# Patient Record
Sex: Male | Born: 1955 | Race: White | Hispanic: No | Marital: Married | Smoking: Former smoker
Health system: Southern US, Community
[De-identification: ages and names within clinical notes are randomized; demographics above are authoritative.]

## PROBLEM LIST (undated history)

## (undated) DIAGNOSIS — M549 Dorsalgia, unspecified: Secondary | ICD-10-CM

## (undated) DIAGNOSIS — I10 Essential (primary) hypertension: Secondary | ICD-10-CM

## (undated) DIAGNOSIS — J449 Chronic obstructive pulmonary disease, unspecified: Secondary | ICD-10-CM

## (undated) DIAGNOSIS — E785 Hyperlipidemia, unspecified: Secondary | ICD-10-CM

## (undated) DIAGNOSIS — S060X9A Concussion with loss of consciousness of unspecified duration, initial encounter: Secondary | ICD-10-CM

## (undated) DIAGNOSIS — R413 Other amnesia: Secondary | ICD-10-CM

## (undated) DIAGNOSIS — S060XAA Concussion with loss of consciousness status unknown, initial encounter: Secondary | ICD-10-CM

## (undated) DIAGNOSIS — H409 Unspecified glaucoma: Secondary | ICD-10-CM

## (undated) DIAGNOSIS — M479 Spondylosis, unspecified: Secondary | ICD-10-CM

## (undated) DIAGNOSIS — G2581 Restless legs syndrome: Secondary | ICD-10-CM

## (undated) DIAGNOSIS — S0291XA Unspecified fracture of skull, initial encounter for closed fracture: Secondary | ICD-10-CM

## (undated) DIAGNOSIS — L309 Dermatitis, unspecified: Secondary | ICD-10-CM

## (undated) HISTORY — DX: Essential (primary) hypertension: I10

## (undated) HISTORY — PX: KNEE SURGERY: SHX244

## (undated) HISTORY — DX: Unspecified glaucoma: H40.9

## (undated) HISTORY — DX: Dermatitis, unspecified: L30.9

## (undated) HISTORY — DX: Concussion with loss of consciousness of unspecified duration, initial encounter: S06.0X9A

## (undated) HISTORY — PX: SHOULDER SURGERY: SHX246

## (undated) HISTORY — DX: Concussion with loss of consciousness status unknown, initial encounter: S06.0XAA

## (undated) HISTORY — DX: Chronic obstructive pulmonary disease, unspecified: J44.9

## (undated) HISTORY — PX: BACK SURGERY: SHX140

## (undated) HISTORY — DX: Hyperlipidemia, unspecified: E78.5

## (undated) HISTORY — DX: Unspecified fracture of skull, initial encounter for closed fracture: S02.91XA

## (undated) HISTORY — DX: Spondylosis, unspecified: M47.9

---
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
## 2002-10-23 ENCOUNTER — Ambulatory Visit (HOSPITAL_COMMUNITY): Admission: RE | Admit: 2002-10-23 | Discharge: 2002-10-23 | Payer: Self-pay | Admitting: Gastroenterology

## 2006-03-20 ENCOUNTER — Emergency Department (HOSPITAL_COMMUNITY): Admission: EM | Admit: 2006-03-20 | Discharge: 2006-03-20 | Payer: Self-pay | Admitting: Emergency Medicine

## 2006-04-17 ENCOUNTER — Encounter (INDEPENDENT_AMBULATORY_CARE_PROVIDER_SITE_OTHER): Payer: Self-pay | Admitting: Specialist

## 2006-04-17 ENCOUNTER — Ambulatory Visit (HOSPITAL_BASED_OUTPATIENT_CLINIC_OR_DEPARTMENT_OTHER): Admission: RE | Admit: 2006-04-17 | Discharge: 2006-04-17 | Payer: Self-pay | Admitting: Orthopedic Surgery

## 2006-04-28 ENCOUNTER — Observation Stay (HOSPITAL_COMMUNITY): Admission: AD | Admit: 2006-04-28 | Discharge: 2006-04-29 | Payer: Self-pay | Admitting: Orthopedic Surgery

## 2006-10-15 ENCOUNTER — Emergency Department (HOSPITAL_COMMUNITY): Admission: EM | Admit: 2006-10-15 | Discharge: 2006-10-16 | Payer: Self-pay | Admitting: Emergency Medicine

## 2006-10-22 ENCOUNTER — Emergency Department (HOSPITAL_COMMUNITY): Admission: EM | Admit: 2006-10-22 | Discharge: 2006-10-22 | Payer: Self-pay | Admitting: Family Medicine

## 2006-10-24 ENCOUNTER — Emergency Department (HOSPITAL_COMMUNITY): Admission: EM | Admit: 2006-10-24 | Discharge: 2006-10-24 | Payer: Self-pay | Admitting: Emergency Medicine

## 2006-11-04 ENCOUNTER — Emergency Department (HOSPITAL_COMMUNITY): Admission: EM | Admit: 2006-11-04 | Discharge: 2006-11-04 | Payer: Self-pay | Admitting: Family Medicine

## 2008-04-21 ENCOUNTER — Encounter: Admission: RE | Admit: 2008-04-21 | Discharge: 2008-04-21 | Payer: Self-pay | Admitting: Family Medicine

## 2009-07-19 ENCOUNTER — Encounter: Admission: RE | Admit: 2009-07-19 | Discharge: 2009-07-19 | Payer: Self-pay | Admitting: Family Medicine

## 2010-08-26 NOTE — Op Note (Signed)
NAMETYKEVIOUS, KENDERDINE          ACCOUNT NO.:  [id number]   MEDICAL RECORD NO.:  [id number]          PATIENT TYPE:  EMS   LOCATION:  ED                           FACILITY:  [facility]   PHYSICIAN:  Satira Anis. Gramig III, M.D.DATE OF BIRTH:  03/19/1956   DATE OF PROCEDURE:  DATE OF DISCHARGE:                               OPERATIVE REPORT   I had the pleasure of seeing Christopher Lee in the [facility]  emergency room, upon referral from emergency room staff.  The patient is  a pleasant 55 year old male who was on the job today working with a  piece of plywood and sustained an injury to his right index finger.  A  piece of plywood imploded into the volar aspect of his finger.  He  complains of pain, inability to move the finger, and difficulty in  general with activities.  He notes no locking or catching but is  certainly having great difficulty moving it.  He has been given a  tetanus shot and is now up to date.   PAST MEDICAL HISTORY:  Glaucoma.   PAST SURGICAL HISTORY:  None.   MEDICATIONS:  None.   ALLERGIES:  None.   SOCIAL HISTORY:  He is a nonsmoker.  He works Architect.  He was  injured on the job today.   PHYSICAL EXAMINATION:  GENERAL:  This patient is alert and oriented in  no acute distress.  EXTREMITIES:  Lower extremity exam is normal.  His left upper extremity  is neurovascularly intact with normal alignment, stability, and range of  motion.  The patient has a noted painful right index finger with an  entrance site volar radially.  There is no exit, but he tents the skin  ulnarly.  He does not have ability to move the finger due to pain.  I  have reviewed this at length and his findings.  X-rays are reviewed,  which are negative.   IMPRESSION:  Status post puncture wound with large piece of wood still  imbedded in the index finger, pain and loss of function.   PLAN:  I have discussed the patient's findings and treatment options.  At the present time, he  desires to proceed with surgical I&D, foreign  body removal/extraction, and evaluation of the radial digital nerve as  necessary.   OPERATION:  __________ and taken to the procedural suite.  He was given  intermetacarpal block, performed with lidocaine and Marcaine by myself  at the hand/palm level.  Following this, he was prepped and draped two  times with Betadine scrub.  He was then prepped and draped accordingly.  Following this, I made an incision off of the volar radial aspect of the  finger.  I dissected down and very meticulously, circumferentially  mobilize the mass.  Following this, the mass/foreign body, which was a  large piece of wood, was removed.  This was actually 2.5 cm in length  and quite deeply embedded.  I was able to achieve full removal.  Following this, he was able to move his finger nicely.   I performed a limited external neurolysis of the radial digital nerve.  There are no complicating features.   He was irrigated copiously.  Following this, he was packed and tolerated  the packing procedure nicely.   He is going to be discharged home on Keflex 750 mg 1 p.o. b.i.d. x10  days, Vicodin 1-2 q.4-6h. p.r.n. pain p.o., dispense #40, no refill.  He  is going to return to see [place] in the office in 36-48 hours and will begin  active range of motion, whirlpools, etc.  I am going to have him return  to work on March 21, 2006, light duty.  No use of the right hand and  will look forward to seeing him on Thursday.  He will be given 2 gm of  Ancef IM prior to discharge.   OPERATIVE PROCEDURE:  1. The patient thus underwent I&D, skin and subcutaneous tissue and      tendon sheath tissue.  2. External neurolysis, radial digital nerve, right index finger.  3. Removal of a large, deep foreign body/2.5 cm piece of wood.   SURGEON:  Satira Anis. Amedeo Plenty, M.D.   ASSISTANT:  Avelina Laine, P.A.-C.           ______________________________  Satira Anis. Blanchie Dessert,  M.D.     Sampson Si  D:  03/20/2006  T:  03/21/2006  Job:  [id number]

## 2010-08-26 NOTE — Op Note (Signed)
Christopher Lee, Christopher Lee          ACCOUNT NO.:  [id number]   MEDICAL RECORD NO.:  [id number]          PATIENT TYPE:  AMB   LOCATION:  [facility]                         FACILITY:  [facility]   PHYSICIAN:  Lauretta Grill, M.D.    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   PROCEDURE:  Right elbow olecranon bursectomy with small spur excision.   SURGEON:  Maia Breslow, MD   ASSISTANT:  Billey Chang, PA-C   ANESTHESIA:  General with LMA.   CULTURES:  None.   DRAINS:  None.   ESTIMATED BLOOD LOSS:  Minimal.   TOURNIQUET TIME:  45 minutes.   PATHOLOGIC FINDINGS AND HISTORY:  Damarquis has a Workers' Comp injury  when he had olecranon bursitis, fell onto his elbow prying some wood  forms off.  He was seen by Dr. Suzi Roots at [facility]  who did some cortisone injections, but he has recurred.  At preoperative  evaluation, he had a small spur off the olecranon.  He decided to  proceed with excision.  At surgery, a large bursal sac was excised.  He  did have a very small prominence and spurring off the olecranon which we  smoothed, although we did not try to dig out any spur in the olecranon  tendon in the triceps tendon attachment.  There was no sharp bony  prominence at closure.   PROCEDURE:  With adequate anesthesia obtained using LMA technique, 1 g  Ancef given IV prophylaxis, the patient was placed in the supine  position.  The right upper extremity was prepped from the fingertips to  the upper arm in the standard fashion.  After standard prepping and  draping, Esmarch exsanguination was used.  The tourniquet was let up to  250 mmHg.  A longitudinal skin incision was then made over the  olecranon, incision deepened  sharply with a knife and hemostasis  obtained using the Bovie electrocoagulator.  Under loupe magnification,  dissection was carried down, and careful dissection of this layer of the  subdermic to the bursal sac was carried out with dissection of the  bursal sac which measured about 3 x 3 cm off the olecranon.  I then  irrigated and closed with through-and-through 3-0 Vicryl sutures from  skin to the olecranon.  I then closed with a running 3-0 nylon which was  an alternating vertical mattress suture.  Bulky sterile compressive  dressing was applied with Ace  and sling.  Then 0.5% Marcaine was injected in and about the olecranon.  The patient having tolerated the procedure well, was awakened and taken  to the recovery room in satisfactory condition to be discharged per  outpatient routine, given Vicodin for pain, and told to call the office  for recheck Friday.           ______________________________  V. Hiram Comber, M.D.     VEP/MEDQ  D:  04/17/2006  T:  04/17/2006  Job:  [id number]

## 2010-08-26 NOTE — Op Note (Signed)
   Christopher Lee, Christopher Lee                      ACCOUNT NO.:  [id number]   MEDICAL RECORD NO.:  [id number]                   PATIENT TYPE:  AMB   LOCATION:  ENDO                                 FACILITY:  [facility]   PHYSICIAN:  John C. Madilyn Fireman, M.D.                 DATE OF BIRTH:  12-17-1955   DATE OF PROCEDURE:  10/23/2002  DATE OF DISCHARGE:                                 OPERATIVE REPORT   PROCEDURE:  Colonoscopy.   INDICATIONS FOR PROCEDURE:  Rectal bleeding and heme positive stools.   DESCRIPTION OF PROCEDURE:  The patient was placed in the left lateral  decubitus position then placed on the pulse monitor with continuous low flow  oxygen delivered by nasal cannula. She was sedated with 100 mcg IV fentanyl  and 10 mg IV Versed. The Olympus video colonoscope was inserted into the  rectum and advanced to the cecum, confirmed by transillumination at  McBurney's point and visualization of the ileocecal valve and appendiceal  orifice. The prep was excellent. The cecum, ascending, transverse,  descending and sigmoid colon all appeared normal with no masses, polyps,  diverticula or other mucosal abnormalities. The rectum likewise appeared  normal and retroflexed view of the anus revealed only small internal  hemorrhoids. The colonoscope was then withdrawn and the patient returned to  the recovery room in stable condition. The patient tolerated the procedure  well and there were no immediate complications.   IMPRESSION:  Internal hemorrhoids otherwise normal study.   PLAN:  1. Next colon screening by sigmoidoscopy in five years.  2. Treat hemorrhoids symptomatically.                                               John C. Madilyn Fireman, M.D.    JCH/MEDQ  D:  10/23/2002  T:  10/23/2002  Job:  [id number]   cc:   Jethro Bastos, M.D.  [address]  [place]  [place] [address]  Fax: [phone]

## 2011-01-23 LAB — URINALYSIS, ROUTINE W REFLEX MICROSCOPIC
Bilirubin Urine: NEGATIVE
Glucose, UA: NEGATIVE
Hgb urine dipstick: NEGATIVE
Ketones, ur: NEGATIVE
Nitrite: NEGATIVE
Protein, ur: NEGATIVE
Specific Gravity, Urine: 1.018
Urobilinogen, UA: 0.2
pH: 6.5

## 2011-01-23 LAB — I-STAT 8, (EC8 V) (CONVERTED LAB)
Acid-Base Excess: 3 — ABNORMAL HIGH
BUN: 16
Bicarbonate: 26.6 — ABNORMAL HIGH
Chloride: 105
Glucose, Bld: 97
HCT: 43
Hemoglobin: 14.6
Operator id: 272551
Potassium: 4.1
Sodium: 136
TCO2: 28
pCO2, Ven: 36.3 — ABNORMAL LOW
pH, Ven: 7.474 — ABNORMAL HIGH

## 2011-01-23 LAB — DIFFERENTIAL
Basophils Absolute: 0.1
Basophils Relative: 1
Eosinophils Absolute: 0.1
Eosinophils Relative: 2
Lymphocytes Relative: 30
Lymphs Abs: 2
Monocytes Absolute: 0.9 — ABNORMAL HIGH
Monocytes Relative: 14 — ABNORMAL HIGH
Neutro Abs: 3.4
Neutrophils Relative %: 52

## 2011-01-23 LAB — CBC
HCT: 41.1
Hemoglobin: 13.9
MCHC: 33.9
MCV: 85.2
Platelets: 319
RBC: 4.82
RDW: 12.5
WBC: 6.5

## 2011-01-23 LAB — PROTIME-INR
INR: 0.9
Prothrombin Time: 12.3

## 2011-01-23 LAB — POCT I-STAT CREATININE
Creatinine, Ser: 0.9
Operator id: 272551

## 2011-01-23 LAB — APTT: aPTT: 27

## 2011-07-04 ENCOUNTER — Ambulatory Visit
Admission: RE | Admit: 2011-07-04 | Discharge: 2011-07-04 | Disposition: A | Discharge status: Home / Self Care | Payer: BC Managed Care – PPO | Source: Ambulatory Visit | Attending: Family Medicine | Admitting: Family Medicine

## 2011-07-04 ENCOUNTER — Other Ambulatory Visit: Payer: Self-pay | Admitting: Family Medicine

## 2011-07-04 DIAGNOSIS — R52 Pain, unspecified: Secondary | ICD-10-CM

## 2011-12-30 ENCOUNTER — Encounter (HOSPITAL_COMMUNITY): Payer: Self-pay | Admitting: *Deleted

## 2011-12-30 ENCOUNTER — Observation Stay (HOSPITAL_COMMUNITY)
Admission: EM | Admit: 2011-12-30 | Discharge: 2011-12-30 | Disposition: A | Discharge status: Home / Self Care | Payer: BC Managed Care – PPO | Attending: Emergency Medicine | Admitting: Emergency Medicine

## 2011-12-30 ENCOUNTER — Emergency Department (HOSPITAL_COMMUNITY): Payer: BC Managed Care – PPO

## 2011-12-30 ENCOUNTER — Observation Stay (HOSPITAL_COMMUNITY): Payer: BC Managed Care – PPO

## 2011-12-30 DIAGNOSIS — M5136 Other intervertebral disc degeneration, lumbar region: Secondary | ICD-10-CM

## 2011-12-30 DIAGNOSIS — S335XXA Sprain of ligaments of lumbar spine, initial encounter: Principal | ICD-10-CM | POA: Insufficient documentation

## 2011-12-30 DIAGNOSIS — R3911 Hesitancy of micturition: Secondary | ICD-10-CM | POA: Insufficient documentation

## 2011-12-30 DIAGNOSIS — M549 Dorsalgia, unspecified: Secondary | ICD-10-CM

## 2011-12-30 DIAGNOSIS — X500XXA Overexertion from strenuous movement or load, initial encounter: Secondary | ICD-10-CM | POA: Insufficient documentation

## 2011-12-30 HISTORY — DX: Dorsalgia, unspecified: M54.9

## 2011-12-30 HISTORY — DX: Restless legs syndrome: G25.81

## 2011-12-30 LAB — POCT I-STAT, CHEM 8
BUN: 15 mg/dL (ref 6–23)
Calcium, Ion: 1.2 mmol/L (ref 1.12–1.23)
Chloride: 99 mEq/L (ref 96–112)
Creatinine, Ser: 0.9 mg/dL (ref 0.50–1.35)
Glucose, Bld: 90 mg/dL (ref 70–99)
HCT: 42 % (ref 39.0–52.0)
Hemoglobin: 14.3 g/dL (ref 13.0–17.0)
Potassium: 4.7 mEq/L (ref 3.5–5.1)
Sodium: 138 mEq/L (ref 135–145)
TCO2: 28 mmol/L (ref 0–100)

## 2011-12-30 LAB — URINALYSIS, ROUTINE W REFLEX MICROSCOPIC
Bilirubin Urine: NEGATIVE
Glucose, UA: NEGATIVE mg/dL
Hgb urine dipstick: NEGATIVE
Ketones, ur: NEGATIVE mg/dL
Leukocytes, UA: NEGATIVE
Nitrite: NEGATIVE
Protein, ur: NEGATIVE mg/dL
Specific Gravity, Urine: 1.021 (ref 1.005–1.030)
Urobilinogen, UA: 1 mg/dL (ref 0.0–1.0)
pH: 7 (ref 5.0–8.0)

## 2011-12-30 MED ORDER — OXYCODONE-ACETAMINOPHEN 5-325 MG PO TABS: 1.0000 | ORAL_TABLET | Freq: Once | ORAL | Status: DC

## 2011-12-30 MED ORDER — CYCLOBENZAPRINE HCL 10 MG PO TABS: 10.0000 mg | ORAL_TABLET | Freq: Two times a day (BID) | ORAL | Status: DC | PRN

## 2011-12-30 MED ORDER — TIMOLOL HEMIHYDRATE 0.5 % OP SOLN
1.0000 [drp] | Freq: Two times a day (BID) | OPHTHALMIC | Status: DC
  Filled 2011-12-30 (×19): qty 0.1

## 2011-12-30 MED ORDER — SODIUM CHLORIDE 0.9 % IV SOLN
Freq: Once | INTRAVENOUS | Status: AC
  Administered 2011-12-30: 14:00:00 via INTRAVENOUS

## 2011-12-30 MED ORDER — PREDNISONE 10 MG PO TABS: ORAL_TABLET | ORAL | Status: DC

## 2011-12-30 MED ORDER — KETOROLAC TROMETHAMINE 30 MG/ML IJ SOLN
30.0000 mg | Freq: Four times a day (QID) | INTRAMUSCULAR | Status: DC | PRN
  Administered 2011-12-30: 30 mg via INTRAVENOUS
  Filled 2011-12-30: qty 1

## 2011-12-30 MED ORDER — MORPHINE SULFATE 4 MG/ML IJ SOLN
4.0000 mg | Freq: Once | INTRAMUSCULAR | Status: AC
  Administered 2011-12-30: 4 mg via INTRAVENOUS
  Filled 2011-12-30: qty 1

## 2011-12-30 MED ORDER — DIAZEPAM 5 MG PO TABS
5.0000 mg | ORAL_TABLET | Freq: Once | ORAL | Status: AC
  Administered 2011-12-30: 5 mg via ORAL
  Filled 2011-12-30: qty 1

## 2011-12-30 MED ORDER — PREDNISONE 20 MG PO TABS
60.0000 mg | ORAL_TABLET | Freq: Once | ORAL | Status: AC
  Administered 2011-12-30: 60 mg via ORAL
  Filled 2011-12-30: qty 3

## 2011-12-30 MED ORDER — TIMOLOL MALEATE 0.5 % OP SOLN
1.0000 [drp] | Freq: Two times a day (BID) | OPHTHALMIC | Status: DC
Start: 2011-12-30 — End: 2011-12-30
  Administered 2011-12-30: 1 [drp] via OPHTHALMIC
  Filled 2011-12-30: qty 5

## 2011-12-30 MED ORDER — DIAZEPAM 5 MG PO TABS
5.0000 mg | ORAL_TABLET | Freq: Four times a day (QID) | ORAL | Status: DC | PRN
  Administered 2011-12-30: 5 mg via ORAL
  Filled 2011-12-30: qty 1

## 2011-12-30 MED ORDER — PREGABALIN 75 MG PO CAPS
225.0000 mg | ORAL_CAPSULE | Freq: Two times a day (BID) | ORAL | Status: DC
Start: 2011-12-30 — End: 2011-12-30

## 2011-12-30 MED ORDER — GADOBENATE DIMEGLUMINE 529 MG/ML IV SOLN
20.0000 mL | Freq: Once | INTRAVENOUS | Status: AC
  Administered 2011-12-30: 20 mL via INTRAVENOUS

## 2011-12-30 MED ORDER — MORPHINE SULFATE 4 MG/ML IJ SOLN
4.0000 mg | INTRAMUSCULAR | Status: DC | PRN
  Administered 2011-12-30: 4 mg via INTRAVENOUS
  Filled 2011-12-30: qty 1

## 2011-12-30 MED ORDER — BIMATOPROST 0.03 % OP SOLN
1.0000 [drp] | Freq: Every day | OPHTHALMIC | Status: DC
  Filled 2011-12-30: qty 2.5

## 2011-12-30 MED ORDER — OXYCODONE-ACETAMINOPHEN 5-325 MG PO TABS
1.0000 | ORAL_TABLET | ORAL | Status: DC | PRN
Start: 2011-12-30 — End: 2012-12-31

## 2011-12-30 MED ORDER — ONDANSETRON HCL 4 MG/2ML IJ SOLN
4.0000 mg | Freq: Four times a day (QID) | INTRAMUSCULAR | Status: DC | PRN
  Administered 2011-12-30: 4 mg via INTRAVENOUS
  Filled 2011-12-30: qty 2

## 2011-12-30 NOTE — ED Provider Notes (Signed)
Medical screening examination/treatment/procedure(s) were conducted as a shared visit with non-physician practitioner(s) and myself.  I personally evaluated the patient during the encounter   Cyndra Numbers, MD 12/30/11 2010
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
## 2011-12-30 NOTE — Discharge Instructions (Signed)
Your MRI shows multiple herniated disks and post surgical changes. Continue flexeril for spasms. Take percocet for severe pain. No driving while taking these medications. Take prednisone as prescribed until all gone starting tomorrow. Follow up with neurosurgery as referred. Return if numbness or weakness in legs, if unable to control bowels or urine.   Herniated Disk The bones of your spinal column (vertebrae) protect your spinal cord and nerves that go into your arms and legs. The vertebrae are separated by disks that cushion the spinal column and put space between your vertebrae. This allows movement between the vertebrae, which allows you to bend, rotate, and move your body from side to side. Sometimes, the disks move out of place (herniate) or break open (rupture) from injury or strain. The most common area for a disk herniation is in the lower back (lumbar area). Sometimes herniation occurs in the neck (cervical) disks.  CAUSES  As we grow older, the strong, fibrous cords that connect the vertebrae and support and surround the disks (ligaments) start to weaken. A strain on the back may cause a break in the disk ligaments. RISK FACTORS Herniated disks occur most often in men who are aged 3 years to 35 years, usually after strenuous activity. Other risk factors include conditions present at birth (congenital) that affect the size of the lumbar spinal canal. Additionally, a narrowing of the areas where the nerves exit the spinal canal can occur as you age. SYMPTOMS  Symptoms of a herniated disk vary. You may have weakness in certain muscles. This weakness can include difficulty lifting your leg or arm, difficulty standing on your toes on one side, or difficulty squeezing tightly with one of your hands. You may have numbness. You may feel a mild tingling, dull ache, or a burning or pulsating pain. In some cases, the pain is severe enough that you are unable to move. The pain most often occurs on one  side of the body. The pain often starts slowly. It may get worse:  After you sit or stand.   At night.   When you sneeze, cough, or laugh.   When you bend backwards or walk more than a few yards.  The pain, numbness, or weakness will often go away or improve a lot over a period of weeks to months. Herniated lumbar disk Symptoms of a herniated lumbar disk may include sharp pain in one part of your leg, hip, or buttocks and numbness in other parts. You also may feel pain or numbness on the back of your calf or the top or sole of your foot. The same leg also may feel weak. Herniated cervical disk Symptoms of a herniated cervical disk may include pain when you move your neck, deep pain near or over your shoulder blade, or pain that moves to your upper arm, forearm, or fingers. DIAGNOSIS  To diagnose a herniated disk, your caregiver will perform a physical exam. Your caregiver also may perform diagnostic tests to see your disk or to test the reaction of your muscles and the function of your nerves. During the physical exam, your caregiver may ask you to:  Sit, stand, and walk. While you walk, your caregiver may ask you to try walking on your toes and then your heels.   Bend forward, backward, and sideways.   Raise your shoulders, elbow, wrist, and fingers and check your strength during these tasks.  Your caregiver will check for:  Numbness or loss of feeling.   Muscle reflexes, which may  be slower or missing.   Muscle strength, which may be weaker.   Posture or the way your spine curves.  Diagnostic tests that may be done include:  A spinal X-ray exam to rule out other causes of back pain.   Magnetic resonance imaging (MRI) or computed tomography (CT) scan, which will show if the herniated disk is pressing on your spinal canal.   Electromyography. This is sometimes used to identify the specific area of nerve involvement.  TREATMENT  Initial treatment for a herniated disk is a short  period of rest with medicines for pain. Pain medicines can include nonsteroidal anti-inflammatory medicines (NSAIDs), muscle relaxants for back spasms, and (rarely) narcotic pain medicine for severe pain that does not respond to NSAID use. Bed rest is often limited to 1 or 2 days at the most because prolonged rest can delay recovery. When the herniation involves the lower back, sitting should be avoided as much as possible because sitting increases pressure on the ruptured disk. Sometimes a soft neck collar will be prescribed for a few days to weeks to help support your neck in the case of a cervical herniation. Physical therapy is often prescribed for patients with disk disease. Physical therapists will teach you how to properly lift, dress, walk, and perform other activities. They will work on strengthening the muscles that help support your spine. In some cases, physical therapy alone is not enough to treat a herniated disk. Steroid injections along the involved nerve root may be needed to help control pain. The steroid is injected in the area of the herniated disk and helps by reducing swelling around the disk. Sometimes surgery is the best option to treat a herniated disk.  SEEK IMMEDIATE MEDICAL CARE IF:   You have numbness, tingling, weakness, or problems with the use of your arms or legs.   You have severe headaches that are not relieved with the use of medicines.   You notice a change in your bowel or bladder control.   You have increasing pain in any areas of your body.   You experience shortness of breath, dizziness, or fainting.  MAKE SURE YOU:   Understand these instructions.   Will watch your condition.   Will get help right away if you are not doing well or get worse.  Document Released: 03/24/2000 Document Revised: 03/16/2011 Document Reviewed: 10/28/2010 [facility] Patient Information 2012 [place].
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
## 2011-12-30 NOTE — ED Provider Notes (Signed)
CDU Admission Note  56 yo male with history of chronic back pain exacerbated by recent injury with possible mild urinary retention:  Back: right sided paraspinal muscle tenderness, no TTP in midline or step-offs, slightly decreased sensation in the right lower leg in non-neurologic distribution such as a stocking  Plan:  Patient to have MRI and IV pain control.  Report called to PA and orders placed.  Cyndra Numbers, MD 12/30/11 1339

## 2012-09-17 ENCOUNTER — Other Ambulatory Visit: Payer: Self-pay | Admitting: *Deleted

## 2012-09-17 ENCOUNTER — Encounter: Payer: Self-pay | Admitting: Vascular Surgery

## 2012-09-17 DIAGNOSIS — I83893 Varicose veins of bilateral lower extremities with other complications: Secondary | ICD-10-CM

## 2012-10-28 ENCOUNTER — Encounter: Payer: Self-pay | Admitting: Vascular Surgery

## 2012-10-29 ENCOUNTER — Encounter: Payer: Self-pay | Admitting: Vascular Surgery

## 2012-10-29 ENCOUNTER — Encounter (INDEPENDENT_AMBULATORY_CARE_PROVIDER_SITE_OTHER): Payer: BC Managed Care – PPO | Admitting: *Deleted

## 2012-10-29 ENCOUNTER — Ambulatory Visit (INDEPENDENT_AMBULATORY_CARE_PROVIDER_SITE_OTHER): Payer: BC Managed Care – PPO | Admitting: Vascular Surgery

## 2012-10-29 VITALS — BP 134/79 | HR 57 | Resp 16 | Ht 68.0 in | Wt 215.0 lb

## 2012-10-29 DIAGNOSIS — I83893 Varicose veins of bilateral lower extremities with other complications: Secondary | ICD-10-CM | POA: Insufficient documentation

## 2012-10-29 NOTE — Progress Notes (Signed)
Subjective:     Patient ID: Christopher Lee, male   DOB: 1956/03/19, 57 y.o.   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 He has had varicose veins in the thigh area for many years but they have recently become much larger and much more symptomatic. He describes burning aching and throbbing discomfort as well as distal edema as the day progresses. He has also noticed some darkening of the skin in the lower third of the right leg. He has no history of DVT, thrombophlebitis, stasis ulcers, or bleeding  Past Medical History  Diagnosis Date  . Back pain   . Restless leg syndrome   . Glaucoma   . Hyperlipidemia   . Dermatitis     lower leg  . Spondylosis     History  Substance Use Topics  . Smoking status: Never Smoker   . Smokeless tobacco: Not on file  . Alcohol Use: No    Family History  Problem Relation Age of Onset  . Cancer Mother   . Cancer Father     No Known Allergies  Current outpatient prescriptions:bimatoprost (LUMIGAN) 0.03 % ophthalmic solution, Place 1 drop into both eyes at bedtime., Disp: , Rfl: ;  cyclobenzaprine (FLEXERIL) 10 MG tablet, Take 10 mg by mouth 3 (three) times daily as needed. For spasm, Disp: , Rfl: ;  cyclobenzaprine (FLEXERIL) 10 MG tablet, Take 1 tablet (10 mg total) by mouth 2 (two) times daily as needed for muscle spasms., Disp: 20 tablet, Rfl: 0 dorzolamide-timolol (COSOPT) 22.3-6.8 MG/ML ophthalmic solution, 1 drop 2 (two) times daily., Disp: , Rfl: ;  oxyCODONE-acetaminophen (PERCOCET) 5-325 MG per tablet, Take 1 tablet by mouth every 4 (four) hours as needed for pain., Disp: 20 tablet, Rfl: 0;  predniSONE (DELTASONE) 10 MG tablet, Take 5 tab day 1, take 4 tab day 2, take 3 tab day 3, take 2 tab day 4, and take 1 tab day 5, Disp: 15 tablet, Rfl: 0 pregabalin (LYRICA) 225 MG capsule, Take 225 mg by mouth 2 (two) times daily., Disp: , Rfl: ;  timolol  (BETIMOL) 0.5 % ophthalmic solution, Place 1 drop into both eyes 2 (two) times daily., Disp: , Rfl: ;  traMADol (ULTRAM) 50 MG tablet, Take 50 mg by mouth every 6 (six) hours as needed. For pain, Disp: , Rfl:   BP 134/79  Pulse 57  Resp 16  Ht '5\' 8"'$  (1.727 m)  Wt 215 lb (97.523 kg)  BMI 32.7 kg/m2  Body mass index is 32.7 kg/(m^2).          Review of Systems denies chest pain, dyspnea on exertion, PND, orthopnea, hemoptysis, claudication. Does complain of swelling in right leg, and numbness on the left leg. All other systems negative complete review of systems     Objective:   Physical Exam blood pressure 134/79 heart rate 87 respirations 16 Gen.-alert and oriented x3 in no apparent distress HEENT normal for age Lungs no rhonchi or wheezing Cardiovascular regular rhythm no murmurs carotid pulses 3+ palpable no bruits audible Abdomen soft nontender no palpable masses Musculoskeletal free of  major deformities Skin clear -with the exception of hyperpigmentation lower third of the right leg. Neurologic normal Lower extremities 3+ femoral and dorsalis pedis pulses palpable bilaterally with 1+ edema on the right-no edema on left Right leg with extensive bulging varicosities beginning in mid thigh extending anteriorly and lateral to right knee. In pretibial  region there is very superficial varix right beneath skin. Hyperpigmentation lower third right leg is present but no active ulceration.  Today I ordered a venous duplex exam of the right leg which I reviewed and interpreted. There is no DVT. There is severe reflux in the right great saphenous system supplying these bulging varicosities in the right leg. There is also some deep vein reflux on the right.      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 If no dramatic improvement patient needs a laser ablation right great saphenous vein with 10-20 stab phlebectomy followed by one course of sclerotherapy very superficial varix and right lower leg

## 2012-12-16 ENCOUNTER — Other Ambulatory Visit: Payer: Self-pay | Admitting: Occupational Medicine

## 2012-12-16 ENCOUNTER — Ambulatory Visit
Admission: RE | Admit: 2012-12-16 | Discharge: 2012-12-16 | Disposition: A | Discharge status: Home / Self Care | Payer: Worker's Compensation | Source: Ambulatory Visit | Attending: Occupational Medicine | Admitting: Occupational Medicine

## 2012-12-16 DIAGNOSIS — T1490XA Injury, unspecified, initial encounter: Secondary | ICD-10-CM

## 2012-12-31 ENCOUNTER — Emergency Department (HOSPITAL_COMMUNITY)
Admission: EM | Admit: 2012-12-31 | Discharge: 2012-12-31 | Disposition: A | Discharge status: Home / Self Care | Payer: BC Managed Care – PPO | Attending: Emergency Medicine | Admitting: Emergency Medicine

## 2012-12-31 ENCOUNTER — Encounter (HOSPITAL_COMMUNITY): Payer: Self-pay

## 2012-12-31 DIAGNOSIS — M545 Low back pain, unspecified: Secondary | ICD-10-CM | POA: Insufficient documentation

## 2012-12-31 DIAGNOSIS — M479 Spondylosis, unspecified: Secondary | ICD-10-CM | POA: Insufficient documentation

## 2012-12-31 DIAGNOSIS — M542 Cervicalgia: Secondary | ICD-10-CM | POA: Diagnosis not present

## 2012-12-31 DIAGNOSIS — Z8639 Personal history of other endocrine, nutritional and metabolic disease: Secondary | ICD-10-CM | POA: Diagnosis not present

## 2012-12-31 DIAGNOSIS — Z872 Personal history of diseases of the skin and subcutaneous tissue: Secondary | ICD-10-CM | POA: Diagnosis not present

## 2012-12-31 DIAGNOSIS — G2581 Restless legs syndrome: Secondary | ICD-10-CM | POA: Diagnosis not present

## 2012-12-31 DIAGNOSIS — M549 Dorsalgia, unspecified: Secondary | ICD-10-CM

## 2012-12-31 DIAGNOSIS — Z862 Personal history of diseases of the blood and blood-forming organs and certain disorders involving the immune mechanism: Secondary | ICD-10-CM | POA: Diagnosis not present

## 2012-12-31 DIAGNOSIS — H409 Unspecified glaucoma: Secondary | ICD-10-CM | POA: Insufficient documentation

## 2012-12-31 DIAGNOSIS — Z79899 Other long term (current) drug therapy: Secondary | ICD-10-CM | POA: Diagnosis not present

## 2012-12-31 MED ORDER — OXYCODONE-ACETAMINOPHEN 5-325 MG PO TABS: 1.0000 | ORAL_TABLET | ORAL | Status: DC | PRN

## 2012-12-31 MED ORDER — OXYCODONE-ACETAMINOPHEN 5-325 MG PO TABS: 1.0000 | ORAL_TABLET | Freq: Once | ORAL | Status: DC

## 2012-12-31 MED ORDER — DIAZEPAM 5 MG PO TABS: 5.0000 mg | ORAL_TABLET | Freq: Once | ORAL | Status: DC

## 2012-12-31 MED ORDER — DIAZEPAM 5 MG PO TABS: 5.0000 mg | ORAL_TABLET | Freq: Four times a day (QID) | ORAL | Status: DC | PRN

## 2012-12-31 NOTE — ED Notes (Signed)
Pt presents to ED with c/o lower back pain, posterior neck pain, headache, and pain to Left posterior leg  x3-4 days. Pt reports he had back surgery in 1996. Pt is ambulatory i triage with a slight limp
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
## 2012-12-31 NOTE — Discharge Instructions (Signed)
Back Pain, Adult Low back pain is very common. About 1 in 5 people have back pain.The cause of low back pain is rarely dangerous. The pain often gets better over time.About half of people with a sudden onset of back pain feel better in just 2 weeks. About 8 in 10 people feel better by 6 weeks.  CAUSES Some common causes of back pain include:  Strain of the muscles or ligaments supporting the spine.  Wear and tear (degeneration) of the spinal discs.  Arthritis.  Direct injury to the back. DIAGNOSIS Most of the time, the direct cause of low back pain is not known.However, back pain can be treated effectively even when the exact cause of the pain is unknown.Answering your caregiver's questions about your overall health and symptoms is one of the most accurate ways to make sure the cause of your pain is not dangerous. If your caregiver needs more information, he or she may order lab work or imaging tests (X-rays or MRIs).However, even if imaging tests show changes in your back, this usually does not require surgery. HOME CARE INSTRUCTIONS For many people, back pain returns.Since low back pain is rarely dangerous, it is often a condition that people can learn to [facility] their own.   Remain active. It is stressful on the back to sit or stand in one place. Do not sit, drive, or stand in one place for more than 30 minutes at a time. Take short walks on level surfaces as soon as pain allows.Try to increase the length of time you walk each day.  Do not stay in bed.Resting more than 1 or 2 days can delay your recovery.  Do not avoid exercise or work.Your body is made to move.It is not dangerous to be active, even though your back may hurt.Your back will likely heal faster if you return to being active before your pain is gone.  Pay attention to your body when you bend and lift. Many people have less discomfortwhen lifting if they bend their knees, keep the load close to their bodies,and  avoid twisting. Often, the most comfortable positions are those that put less stress on your recovering back.  Find a comfortable position to sleep. Use a firm mattress and lie on your side with your knees slightly bent. If you lie on your back, put a pillow under your knees.  Only take over-the-counter or prescription medicines as directed by your caregiver. Over-the-counter medicines to reduce pain and inflammation are often the most helpful.Your caregiver may prescribe muscle relaxant drugs.These medicines help dull your pain so you can more quickly return to your normal activities and healthy exercise.  Put ice on the injured area.  Put ice in a plastic bag.  Place a towel between your skin and the bag.  Leave the ice on for 15-20 minutes, 3-4 times a day for the first 2 to 3 days. After that, ice and heat may be alternated to reduce pain and spasms.  Ask your caregiver about trying back exercises and gentle massage. This may be of some benefit.  Avoid feeling anxious or stressed.Stress increases muscle tension and can worsen back pain.It is important to recognize when you are anxious or stressed and learn ways to manage it.Exercise is a great option. SEEK MEDICAL CARE IF:  You have pain that is not relieved with rest or medicine.  You have pain that does not improve in 1 week.  You have new symptoms.  You are generally not feeling well. SEEK  IMMEDIATE MEDICAL CARE IF:   You have pain that radiates from your back into your legs.  You develop new bowel or bladder control problems.  You have unusual weakness or numbness in your arms or legs.  You develop nausea or vomiting.  You develop abdominal pain.  You feel faint. Document Released: 03/27/2005 Document Revised: 09/26/2011 Document Reviewed: 08/15/2010 [facility] Patient Information 2014 [place], [place].  Back Exercises Back exercises help treat and prevent back injuries. The goal of back exercises is to increase  the strength of your abdominal and back muscles and the flexibility of your back. These exercises should be started when you no longer have back pain. Back exercises include:  Pelvic Tilt. Lie on your back with your knees bent. Tilt your pelvis until the lower part of your back is against the floor. Hold this position 5 to 10 sec and repeat 5 to 10 times.  Knee to Chest. Pull first 1 knee up against your chest and hold for 20 to 30 seconds, repeat this with the other knee, and then both knees. This may be done with the other leg straight or bent, whichever feels better.  Sit-Ups or Curl-Ups. Bend your knees 90 degrees. Start with tilting your pelvis, and do a partial, slow sit-up, lifting your trunk only 30 to 45 degrees off the floor. Take at least 2 to 3 seconds for each sit-up. Do not do sit-ups with your knees out straight. If partial sit-ups are difficult, simply do the above but with only tightening your abdominal muscles and holding it as directed.  Hip-Lift. Lie on your back with your knees flexed 90 degrees. Push down with your feet and shoulders as you raise your hips a couple inches off the floor; hold for 10 seconds, repeat 5 to 10 times.  Back arches. Lie on your stomach, propping yourself up on bent elbows. Slowly press on your hands, causing an arch in your low back. Repeat 3 to 5 times. Any initial stiffness and discomfort should lessen with repetition over time.  Shoulder-Lifts. Lie face down with arms beside your body. Keep hips and torso pressed to floor as you slowly lift your head and shoulders off the floor. Do not overdo your exercises, especially in the beginning. Exercises may cause you some mild back discomfort which lasts for a few minutes; however, if the pain is more severe, or lasts for more than 15 minutes, do not continue exercises until you see your caregiver. Improvement with exercise therapy for back problems is slow.  See your caregivers for assistance with  developing a proper back exercise program. Document Released: 05/04/2004 Document Revised: 06/19/2011 Document Reviewed: 01/26/2011 [facility] Patient Information 2014 [place]. Cryotherapy Cryotherapy means treatment with cold. Ice or gel packs can be used to reduce both pain and swelling. Ice is the most helpful within the first 24 to 48 hours after an injury or flareup from overusing a muscle or joint. Sprains, strains, spasms, burning pain, shooting pain, and aches can all be eased with ice. Ice can also be used when recovering from surgery. Ice is effective, has very few side effects, and is safe for most people to use. PRECAUTIONS  Ice is not a safe treatment option for people with:  Raynaud's phenomenon. This is a condition affecting small blood vessels in the extremities. Exposure to cold may cause your problems to return.  Cold hypersensitivity. There are many forms of cold hypersensitivity, including:  Cold urticaria. Red, itchy hives appear on the skin when  the tissues begin to warm after being iced.  Cold erythema. This is a red, itchy rash caused by exposure to cold.  Cold hemoglobinuria. Red blood cells break down when the tissues begin to warm after being iced. The hemoglobin that carry oxygen are passed into the urine because they cannot combine with blood proteins fast enough.  Numbness or altered sensitivity in the area being iced. If you have any of the following conditions, do not use ice until you have discussed cryotherapy with your caregiver:  Heart conditions, such as arrhythmia, angina, or chronic heart disease.  High blood pressure.  Healing wounds or open skin in the area being iced.  Current infections.  Rheumatoid arthritis.  Poor circulation.  Diabetes. Ice slows the blood flow in the region it is applied. This is beneficial when trying to stop inflamed tissues from spreading irritating chemicals to surrounding tissues. However, if you expose your  skin to cold temperatures for too long or without the proper protection, you can damage your skin or nerves. Watch for signs of skin damage due to cold. HOME CARE INSTRUCTIONS Follow these tips to use ice and cold packs safely.  Place a dry or damp towel between the ice and skin. A damp towel will cool the skin more quickly, so you may need to shorten the time that the ice is used.  For a more rapid response, add gentle compression to the ice.  Ice for no more than 10 to 20 minutes at a time. The bonier the area you are icing, the less time it will take to get the benefits of ice.  Check your skin after 5 minutes to make sure there are no signs of a poor response to cold or skin damage.  Rest 20 minutes or more in between uses.  Once your skin is numb, you can end your treatment. You can test numbness by very lightly touching your skin. The touch should be so light that you do not see the skin dimple from the pressure of your fingertip. When using ice, most people will feel these normal sensations in this order: cold, burning, aching, and numbness.  Do not use ice on someone who cannot communicate their responses to pain, such as small children or people with dementia. HOW TO MAKE AN ICE PACK Ice packs are the most common way to use ice therapy. Other methods include ice massage, ice baths, and cryo-sprays. Muscle creams that cause a cold, tingly feeling do not offer the same benefits that ice offers and should not be used as a substitute unless recommended by your caregiver. To make an ice pack, do one of the following:  Place crushed ice or a bag of frozen vegetables in a sealable plastic bag. Squeeze out the excess air. Place this bag inside another plastic bag. Slide the bag into a pillowcase or place a damp towel between your skin and the bag.  Mix 3 parts water with 1 part rubbing alcohol. Freeze the mixture in a sealable plastic bag. When you remove the mixture from the freezer, it will  be slushy. Squeeze out the excess air. Place this bag inside another plastic bag. Slide the bag into a pillowcase or place a damp towel between your skin and the bag. SEEK MEDICAL CARE IF:  You develop white spots on your skin. This may give the skin a blotchy (mottled) appearance.  Your skin turns blue or pale.  Your skin becomes waxy or hard.  Your swelling gets worse.  MAKE SURE YOU:   Understand these instructions.  Will watch your condition.  Will get help right away if you are not doing well or get worse. Document Released: 11/21/2010 Document Revised: 06/19/2011 Document Reviewed: 11/21/2010 [facility] Patient Information 2014 [place], [place].

## 2012-12-31 NOTE — ED Provider Notes (Signed)
Medical screening examination/treatment/procedure(s) were performed by non-physician practitioner and as supervising physician I was immediately available for consultation/collaboration.   Gavin Pound. Sharleen Szczesny, MD 12/31/12 1606

## 2013-02-03 ENCOUNTER — Encounter: Payer: Self-pay | Admitting: Vascular Surgery

## 2013-02-04 ENCOUNTER — Ambulatory Visit: Payer: BC Managed Care – PPO | Admitting: Vascular Surgery

## 2013-02-07 ENCOUNTER — Encounter: Payer: Self-pay | Admitting: Vascular Surgery

## 2013-02-10 ENCOUNTER — Encounter: Payer: Self-pay | Admitting: Vascular Surgery

## 2013-02-10 ENCOUNTER — Ambulatory Visit (INDEPENDENT_AMBULATORY_CARE_PROVIDER_SITE_OTHER): Payer: BC Managed Care – PPO | Admitting: Vascular Surgery

## 2013-02-10 ENCOUNTER — Encounter (INDEPENDENT_AMBULATORY_CARE_PROVIDER_SITE_OTHER): Payer: Self-pay

## 2013-02-10 VITALS — BP 131/81 | HR 72 | Resp 18 | Ht 68.0 in | Wt 218.0 lb

## 2013-02-10 DIAGNOSIS — I83893 Varicose veins of bilateral lower extremities with other complications: Secondary | ICD-10-CM

## 2013-02-10 NOTE — Progress Notes (Signed)
Subjective:     Patient ID: Christopher Lee, male   DOB: May 13, 1955, 57 y.o.   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 He was found to have gross reflux in the right great saphenous vein supplying these bulging varicosities which have become increasingly painful. He works as a Equities trader. He has 1 long-leg elastic compression stockings 20-30 mm gradient and tried elevation as much as his job will allow and ibuprofen with no improvement in his symptomatology.  Past Medical History  Diagnosis Date  . Back pain   . Restless leg syndrome   . Glaucoma   . Hyperlipidemia   . Dermatitis     lower leg  . Spondylosis     History  Substance Use Topics  . Smoking status: Never Smoker   . Smokeless tobacco: Never Used  . Alcohol Use: No    Family History  Problem Relation Age of Onset  . Cancer Mother   . Cancer Father     No Known Allergies  Current outpatient prescriptions:bimatoprost (LUMIGAN) 0.03 % ophthalmic solution, Place 1 drop into both eyes at bedtime., Disp: , Rfl: ;  cyclobenzaprine (FLEXERIL) 10 MG tablet, Take 1 tablet (10 mg total) by mouth 2 (two) times daily as needed for muscle spasms., Disp: 20 tablet, Rfl: 0;  diazepam (VALIUM) 5 MG tablet, Take 1 tablet (5 mg total) by mouth every 6 (six) hours as needed for anxiety., Disp: 15 tablet, Rfl: 0 dorzolamide-timolol (COSOPT) 22.3-6.8 MG/ML ophthalmic solution, Place 1 drop into both eyes 2 (two) times daily. , Disp: , Rfl: ;  oxyCODONE-acetaminophen (PERCOCET/ROXICET) 5-325 MG per tablet, Take 1 tablet by mouth every 4 (four) hours as needed for pain., Disp: 15 tablet, Rfl: 0;  pregabalin (LYRICA) 225 MG capsule, Take 225 mg by mouth 2 (two) times daily., Disp: , Rfl:   BP 131/81  Pulse 72  Resp 18  Ht '5\' 8"'$  (1.727 m)  Wt 218 lb (98.884 kg)  BMI 33.15 kg/m2  Body mass index is 33.15  kg/(m^2).         Review of Systems denies chest pain, dyspnea on exertion, PND, orthopnea, hemoptysis.     Objective:   Physical Exam BP 131/81  Pulse 72  Resp 18  Ht '5\' 8"'$  (1.727 m)  Wt 218 lb (98.884 kg)  BMI 33.15 kg/m2  General well-developed well-nourished male in no apparent stress alert and oriented x3 Lungs no rhonchi or wheezing Right lower extremity with extensive bulging varicosities in the anterior thigh beginning in the lower third extending laterally across the knee into the lateral calf. He has hyperpigmentation distally but no active ulceration with 3+ dorsalis pedis pulse palpable.     Assessment:     Severe venous insufficiency right leg with painful varicosities due to gross reflux right great saphenous system-resistant to conservative measures including long-leg elastic compression stockings, elevation, and ibuprofen. These symptoms are affecting his daily living and ability to work    Plan:     Patient needs a laser ablation right great saphenous vein with 10-20 stab phlebectomy as a  single procedure to be followed by one course of sclerotherapy. Will proceed with precertification to perform this in the near future

## 2013-02-12 ENCOUNTER — Other Ambulatory Visit: Payer: Self-pay | Admitting: *Deleted

## 2013-02-12 DIAGNOSIS — I83893 Varicose veins of bilateral lower extremities with other complications: Secondary | ICD-10-CM

## 2013-02-28 ENCOUNTER — Encounter: Payer: Self-pay | Admitting: Surgery

## 2013-03-03 ENCOUNTER — Ambulatory Visit (INDEPENDENT_AMBULATORY_CARE_PROVIDER_SITE_OTHER): Payer: BC Managed Care – PPO | Admitting: Vascular Surgery

## 2013-03-03 ENCOUNTER — Encounter: Payer: Self-pay | Admitting: Vascular Surgery

## 2013-03-03 VITALS — BP 131/85 | HR 86 | Resp 16 | Ht 68.0 in | Wt 220.0 lb

## 2013-03-03 DIAGNOSIS — I83893 Varicose veins of bilateral lower extremities with other complications: Secondary | ICD-10-CM

## 2013-03-03 NOTE — Progress Notes (Signed)
Subjective:     Patient ID: Christopher Lee, male   DOB: 1955-10-18, 57 y.o.   MRN: [id number]  HPI this 57 year old male had laser ablation right great saphenous vein performed under local tumescent anesthesia with greater than 20 stab phlebectomy of secondary painful varicosities. He tolerated seizure well.   Review of Systems     Objective:   Physical Exam BP 131/85  Pulse 86  Resp 16  Ht 5\' 8"  (1.727 m)  Wt 220 lb (99.791 kg)  BMI 33.46 kg/m2       Assessment:     Well-tolerated laser ablation right great saphenous vein from mid thigh the saphenofemoral junction and greater than 20 stab phlebectomy of painful varicosities performed under local tumescent anesthesia    Plan:     Return December 1 for venous duplex exam to confirm closure right great saphenous vein

## 2013-03-03 NOTE — Progress Notes (Signed)
   Laser Ablation Procedure      Date: 03/03/2013    Christopher Lee DOB:29-Apr-1955  Consent signed: Yes  Surgeon:J.D. Kellie Simmering  Procedure: Laser Ablation: right Greater Saphenous Vein  BP 131/85  Pulse 86  Resp 16  Ht '5\' 8"'$  (1.727 m)  Wt 220 lb (99.791 kg)  BMI 33.46 kg/m2  Start time: 3   End time: 4:15  Tumescent Anesthesia: 425 cc 0.9% NaCl with 50 cc Lidocaine HCL with 1% Epi and 15 cc 8.4% NaHCO3  Local Anesthesia: 9 cc Lidocaine HCL and NaHCO3 (ratio 2:1)  Pulsed mode: 15 watts, 535m delay, 1.0 duration Total energy: 1481, total pulses: 99, total time: 1:39    Stab Phlebectomy: >20 Sites: Thigh and Calf  Patient tolerated procedure well: Yes  Notes:   Description of Procedure:  After marking the course of the saphenous vein and the secondary varicosities in the standing position, the patient was placed on the operating table in the supine position, and the right leg was prepped and draped in sterile fashion. Local anesthetic was administered, and under ultrasound guidance the saphenous vein was accessed with a micro needle and guide wire; then the micro puncture sheath was placed. A guide wire was inserted to the saphenofemoral junction, followed by a 5 french sheath.  The position of the sheath and then the laser fiber below the junction was confirmed using the ultrasound and visualization of the aiming beam.  Tumescent anesthesia was administered along the course of the saphenous vein using ultrasound guidance. Protective laser glasses were placed on the patient, and the laser was fired at pulsed mode 15 watts.  For a total of 1481 joules.  A steri strip was applied to the puncture site.  The patient was then put into Trendelenburg position.  Local anesthetic was utilized overlying the marked varicosities.  Greater than 20 stab wounds were made using the tip of an 11 blade; and using the vein hook,  The phlebectomies were performed using a hemostat to avulse these  varicosities.  Adequate hemostasis was achieved, and steri strips were applied to the stab wound.      ABD pads and thigh high compression stockings were applied.  Ace wrap bandages were applied over the phlebectomy sites and at the top of the saphenofemoral junction.  Blood loss was less than 15 cc.  The patient ambulated out of the operating room having tolerated the procedure well.

## 2013-03-04 ENCOUNTER — Telehealth: Payer: Self-pay | Admitting: *Deleted

## 2013-03-04 ENCOUNTER — Encounter: Payer: Self-pay | Admitting: Vascular Surgery

## 2013-03-04 NOTE — Telephone Encounter (Signed)
Pt having some discomfort in the middle of the night. Following all instructions. Reminded him of his fu appts. No signs of bleeding.

## 2013-03-05 ENCOUNTER — Encounter: Payer: Self-pay | Admitting: Vascular Surgery

## 2013-03-10 ENCOUNTER — Ambulatory Visit (HOSPITAL_COMMUNITY)
Admission: RE | Admit: 2013-03-10 | Discharge: 2013-03-10 | Disposition: A | Discharge status: Home / Self Care | Payer: BC Managed Care – PPO | Source: Ambulatory Visit | Attending: Vascular Surgery | Admitting: Vascular Surgery

## 2013-03-10 ENCOUNTER — Ambulatory Visit (INDEPENDENT_AMBULATORY_CARE_PROVIDER_SITE_OTHER): Payer: BC Managed Care – PPO | Admitting: Vascular Surgery

## 2013-03-10 ENCOUNTER — Encounter: Payer: Self-pay | Admitting: Vascular Surgery

## 2013-03-10 VITALS — BP 130/88 | HR 80 | Resp 16 | Ht 68.0 in | Wt 220.0 lb

## 2013-03-10 DIAGNOSIS — I83893 Varicose veins of bilateral lower extremities with other complications: Secondary | ICD-10-CM | POA: Insufficient documentation

## 2013-03-10 NOTE — Progress Notes (Signed)
Subjective:     Patient ID: Christopher Lee, male   DOB: November 28, 1955, 57 y.o.   MRN: [id number]  HPI this 57 year old male returns 1 week post laser ablation right great saphenous vein from distal thigh the saphenofemoral junction plus greater than 20 stab phlebectomy of painful varicosities. He has had no pain in the stab phlebectomy sites but has had moderate to severe pain in the thigh and groin area where the ablation was performed. He has tried ibuprofen as well as ice packs and elevation with no complete relief. It does cause difficulty when he ambulates. He has had no chest pain or shortness of breath or hemoptysis. He is wearing his elastic compression stocking. Review of Systems     Objective:   Physical Exam BP 130/88  Pulse 80  Resp 16  Ht 5\' 8"  (1.727 m)  Wt 220 lb (99.791 kg)  BMI 33.46 kg/m2  General well-developed well-nourished male no apparent stress alert and oriented x3 Right leg with some mild erythema overlying the great saphenous vein particularly in the mid to distal thigh. Stab phlebectomy sites are all healing nicely. Great saphenous vein is palpable beneath the skin in the mid thigh. No distal edema is noted.  Today I ordered a venous duplex exam which I reviewed and interpreted. There is no DVT. There is total closure of the right great saphenous vein from distal thigh to near the saphenofemoral junction.      Assessment:     Successful laser ablation right great saphenous vein with multiple stab phlebectomy of painful varicosities-moderate amount of postoperative discomfort due to 2 inflammation surrounding the great saphenous vein with the ablation was performed    Plan:     Oxycodone prescribed 5 mg every 4 hours when necessary for pain in addition to other maneuvers She will need some sclerotherapy for remaining varicosities after a few weeks-we'll see how his pain relief is with new pain medication before making a decision on a date to proceed with  sclerotherapy

## 2013-04-10 HISTORY — PX: NECK SURGERY: SHX720

## 2013-04-14 ENCOUNTER — Other Ambulatory Visit: Payer: Self-pay | Admitting: Orthopaedic Surgery

## 2013-04-14 DIAGNOSIS — M542 Cervicalgia: Secondary | ICD-10-CM

## 2013-04-19 ENCOUNTER — Ambulatory Visit
Admission: RE | Admit: 2013-04-19 | Discharge: 2013-04-19 | Disposition: A | Discharge status: Home / Self Care | Payer: BC Managed Care – PPO | Source: Ambulatory Visit | Attending: Orthopaedic Surgery | Admitting: Orthopaedic Surgery

## 2013-04-19 DIAGNOSIS — M542 Cervicalgia: Secondary | ICD-10-CM

## 2013-04-28 ENCOUNTER — Other Ambulatory Visit: Payer: Self-pay | Admitting: Orthopaedic Surgery

## 2013-04-28 DIAGNOSIS — M546 Pain in thoracic spine: Secondary | ICD-10-CM

## 2013-04-29 ENCOUNTER — Encounter: Payer: Self-pay | Admitting: *Deleted

## 2013-04-30 ENCOUNTER — Ambulatory Visit: Payer: Self-pay | Admitting: *Deleted

## 2013-04-30 NOTE — Progress Notes (Signed)
No show for this appointment

## 2013-05-01 ENCOUNTER — Other Ambulatory Visit: Payer: Self-pay

## 2013-05-07 ENCOUNTER — Ambulatory Visit
Admission: RE | Admit: 2013-05-07 | Discharge: 2013-05-07 | Disposition: A | Discharge status: Home / Self Care | Payer: BC Managed Care – PPO | Source: Ambulatory Visit | Attending: Orthopaedic Surgery | Admitting: Orthopaedic Surgery

## 2013-05-07 DIAGNOSIS — M546 Pain in thoracic spine: Secondary | ICD-10-CM

## 2014-05-16 ENCOUNTER — Observation Stay (HOSPITAL_COMMUNITY)
Admission: EM | Admit: 2014-05-16 | Discharge: 2014-05-19 | Disposition: A | Discharge status: Home / Self Care | Payer: BLUE CROSS/BLUE SHIELD | Attending: Internal Medicine | Admitting: Internal Medicine

## 2014-05-16 ENCOUNTER — Encounter (HOSPITAL_COMMUNITY): Payer: Self-pay | Admitting: Physical Medicine and Rehabilitation

## 2014-05-16 ENCOUNTER — Emergency Department (HOSPITAL_COMMUNITY): Payer: BLUE CROSS/BLUE SHIELD

## 2014-05-16 DIAGNOSIS — E669 Obesity, unspecified: Secondary | ICD-10-CM | POA: Diagnosis not present

## 2014-05-16 DIAGNOSIS — Z79891 Long term (current) use of opiate analgesic: Secondary | ICD-10-CM | POA: Insufficient documentation

## 2014-05-16 DIAGNOSIS — R0602 Shortness of breath: Secondary | ICD-10-CM | POA: Diagnosis not present

## 2014-05-16 DIAGNOSIS — Z87891 Personal history of nicotine dependence: Secondary | ICD-10-CM | POA: Insufficient documentation

## 2014-05-16 DIAGNOSIS — G2581 Restless legs syndrome: Secondary | ICD-10-CM | POA: Insufficient documentation

## 2014-05-16 DIAGNOSIS — M79602 Pain in left arm: Secondary | ICD-10-CM

## 2014-05-16 DIAGNOSIS — R0600 Dyspnea, unspecified: Secondary | ICD-10-CM | POA: Insufficient documentation

## 2014-05-16 DIAGNOSIS — Z79899 Other long term (current) drug therapy: Secondary | ICD-10-CM | POA: Diagnosis not present

## 2014-05-16 DIAGNOSIS — R079 Chest pain, unspecified: Secondary | ICD-10-CM

## 2014-05-16 DIAGNOSIS — F419 Anxiety disorder, unspecified: Secondary | ICD-10-CM | POA: Insufficient documentation

## 2014-05-16 DIAGNOSIS — Z791 Long term (current) use of non-steroidal anti-inflammatories (NSAID): Secondary | ICD-10-CM | POA: Insufficient documentation

## 2014-05-16 DIAGNOSIS — R739 Hyperglycemia, unspecified: Secondary | ICD-10-CM | POA: Insufficient documentation

## 2014-05-16 DIAGNOSIS — R0789 Other chest pain: Principal | ICD-10-CM | POA: Insufficient documentation

## 2014-05-16 DIAGNOSIS — E785 Hyperlipidemia, unspecified: Secondary | ICD-10-CM | POA: Diagnosis not present

## 2014-05-16 LAB — COMPREHENSIVE METABOLIC PANEL
ALT: 32 U/L (ref 0–53)
AST: 33 U/L (ref 0–37)
Albumin: 4.1 g/dL (ref 3.5–5.2)
Alkaline Phosphatase: 68 U/L (ref 39–117)
Anion gap: 11 (ref 5–15)
BUN: 13 mg/dL (ref 6–23)
CO2: 25 mmol/L (ref 19–32)
Calcium: 9.2 mg/dL (ref 8.4–10.5)
Chloride: 104 mmol/L (ref 96–112)
Creatinine, Ser: 0.91 mg/dL (ref 0.50–1.35)
GFR calc Af Amer: >90 mL/min (ref >90)
GFR calc non Af Amer: >90 mL/min (ref >90)
Glucose, Bld: 112 mg/dL — ABNORMAL HIGH (ref 70–99)
Potassium: 3.7 mmol/L (ref 3.5–5.1)
Sodium: 140 mmol/L (ref 135–145)
Total Bilirubin: 0.5 mg/dL (ref 0.3–1.2)
Total Protein: 7.1 g/dL (ref 6.0–8.3)

## 2014-05-16 LAB — CBC WITH DIFFERENTIAL/PLATELET
Basophils Absolute: 0.1 10*3/uL (ref 0.0–0.1)
Basophils Relative: 1 % (ref 0–1)
Eosinophils Absolute: 0.2 10*3/uL (ref 0.0–0.7)
Eosinophils Relative: 3 % (ref 0–5)
HCT: 42.8 % (ref 39.0–52.0)
Hemoglobin: 14.6 g/dL (ref 13.0–17.0)
Lymphocytes Relative: 37 % (ref 12–46)
Lymphs Abs: 2.3 10*3/uL (ref 0.7–4.0)
MCH: 28.6 pg (ref 26.0–34.0)
MCHC: 34.1 g/dL (ref 30.0–36.0)
MCV: 83.9 fL (ref 78.0–100.0)
Monocytes Absolute: 0.8 10*3/uL (ref 0.1–1.0)
Monocytes Relative: 13 % — ABNORMAL HIGH (ref 3–12)
Neutro Abs: 3 10*3/uL (ref 1.7–7.7)
Neutrophils Relative %: 46 % (ref 43–77)
Platelets: 215 10*3/uL (ref 150–400)
RBC: 5.1 MIL/uL (ref 4.22–5.81)
RDW: 13.5 % (ref 11.5–15.5)
WBC: 6.3 10*3/uL (ref 4.0–10.5)

## 2014-05-16 LAB — I-STAT TROPONIN, ED: Troponin i, poc: 0 ng/mL (ref 0.00–0.08)

## 2014-05-16 MED ORDER — ASPIRIN 81 MG PO CHEW
324.0000 mg | CHEWABLE_TABLET | Freq: Once | ORAL | Status: AC
  Administered 2014-05-16: 324 mg via ORAL
  Filled 2014-05-16: qty 4

## 2014-05-16 MED ORDER — DORZOLAMIDE HCL-TIMOLOL MAL 2-0.5 % OP SOLN
1.0000 [drp] | Freq: Two times a day (BID) | OPHTHALMIC | Status: DC
  Administered 2014-05-17 – 2014-05-19 (×5): 1 [drp] via OPHTHALMIC
  Filled 2014-05-16 (×2): qty 10

## 2014-05-16 MED ORDER — ONDANSETRON HCL 4 MG/2ML IJ SOLN: 4.0000 mg | Freq: Four times a day (QID) | INTRAMUSCULAR | Status: DC | PRN

## 2014-05-16 MED ORDER — HYDROCODONE-ACETAMINOPHEN 10-325 MG PO TABS
1.0000 | ORAL_TABLET | Freq: Three times a day (TID) | ORAL | Status: DC | PRN
  Administered 2014-05-17 – 2014-05-18 (×3): 1 via ORAL
  Filled 2014-05-16 (×3): qty 1

## 2014-05-16 MED ORDER — LATANOPROST 0.005 % OP SOLN
1.0000 [drp] | Freq: Every day | OPHTHALMIC | Status: DC
  Administered 2014-05-17 (×2): 1 [drp] via OPHTHALMIC
  Filled 2014-05-16: qty 2.5

## 2014-05-16 MED ORDER — ALBUTEROL SULFATE (2.5 MG/3ML) 0.083% IN NEBU: 2.5000 mg | INHALATION_SOLUTION | Freq: Four times a day (QID) | RESPIRATORY_TRACT | Status: DC | PRN

## 2014-05-16 MED ORDER — ACETAMINOPHEN 325 MG PO TABS: 650.0000 mg | ORAL_TABLET | ORAL | Status: DC | PRN

## 2014-05-16 MED ORDER — MORPHINE SULFATE 4 MG/ML IJ SOLN
4.0000 mg | Freq: Once | INTRAMUSCULAR | Status: AC
  Administered 2014-05-16: 4 mg via INTRAVENOUS
  Filled 2014-05-16: qty 1

## 2014-05-16 MED ORDER — NITROGLYCERIN 0.4 MG SL SUBL
0.4000 mg | SUBLINGUAL_TABLET | SUBLINGUAL | Status: AC | PRN
  Administered 2014-05-16 (×3): 0.4 mg via SUBLINGUAL

## 2014-05-16 MED ORDER — ENOXAPARIN SODIUM 40 MG/0.4ML ~~LOC~~ SOLN
40.0000 mg | SUBCUTANEOUS | Status: DC
  Administered 2014-05-17: 40 mg via SUBCUTANEOUS
  Filled 2014-05-16 (×3): qty 0.4

## 2014-05-16 MED ORDER — MORPHINE SULFATE 2 MG/ML IJ SOLN
2.0000 mg | INTRAMUSCULAR | Status: DC | PRN
  Administered 2014-05-17: 2 mg via INTRAVENOUS
  Filled 2014-05-16: qty 1

## 2014-05-16 MED ORDER — CYCLOBENZAPRINE HCL 10 MG PO TABS: 10.0000 mg | ORAL_TABLET | Freq: Two times a day (BID) | ORAL | Status: DC | PRN

## 2014-05-16 MED ORDER — PREGABALIN 75 MG PO CAPS
225.0000 mg | ORAL_CAPSULE | Freq: Two times a day (BID) | ORAL | Status: DC
  Administered 2014-05-17 – 2014-05-19 (×6): 225 mg via ORAL
  Filled 2014-05-16 (×6): qty 3

## 2014-05-16 NOTE — ED Notes (Signed)
Pt returned from xray

## 2014-05-16 NOTE — H&P (Signed)
PCP:    Orland Mustard at [facility]   Chief Complaint:  Chest and left arm pain  HPI: Christopher Lee is a 59 y.o. male   has a past medical history of Back pain; Restless leg syndrome; Glaucoma; Hyperlipidemia; Dermatitis; and Spondylosis.   Presented with  Patient was walking and shopping when his left arm started to hurt continuously. Patient continued to complaining of it arm pain and around 4 pm the pain started to radiate to the left chest and he felt short of breath. Patient reports feeling short of breath over the past 6 months it has been getting progressively worse.  Denies any pleuritic chest pain. Patient smoked 1 pack a day for over 10 years quit 8 year ago. Patient reports asbestos exposure 10 years ago.  Hospitalist was called for admission for atypical chest pain  Review of Systems:    Pertinent positives include:  shortness of breath at rest.  fatigue, dyspnea on exertion,   Constitutional:  No weight loss, night sweats, Fevers, chills,weight loss  HEENT:  No headaches, Difficulty swallowing,Tooth/dental problems,Sore throat,  No sneezing, itching, ear ache, nasal congestion, post nasal drip,  Cardio-vascular:  No chest pain, Orthopnea, PND, anasarca, dizziness, palpitations.no Bilateral lower extremity swelling  GI:  No heartburn, indigestion, abdominal pain, nausea, vomiting, diarrhea, change in bowel habits, loss of appetite, melena, blood in stool, hematemesis Resp:  no No No excess mucus, no productive cough, No non-productive cough, No coughing up of blood.No change in color of mucus.No wheezing. Skin:  no rash or lesions. No jaundice GU:  no dysuria, change in color of urine, no urgency or frequency. No straining to urinate.  No flank pain.  Musculoskeletal:  No joint pain or no joint swelling. No decreased range of motion. No back pain.  Psych:  No change in mood or affect. No depression or anxiety. No memory loss.  Neuro: no localizing  neurological complaints, no tingling, no weakness, no double vision, no gait abnormality, no slurred speech, no confusion  Otherwise ROS are negative except for above, 10 systems were reviewed  Past Medical History: Past Medical History  Diagnosis Date  . Back pain   . Restless leg syndrome   . Glaucoma   . Hyperlipidemia   . Dermatitis     lower leg  . Spondylosis    Past Surgical History  Procedure Laterality Date  . Back surgery    . Knee surgery    . Shoulder surgery       Medications: Prior to Admission medications   Medication Sig Start Date End Date Taking? Authorizing Provider  atomoxetine (STRATTERA) 100 MG capsule Take 100 mg by mouth daily.   Yes Historical Provider, MD  bimatoprost (LUMIGAN) 0.03 % ophthalmic solution Place 1 drop into both eyes at bedtime.   Yes Historical Provider, MD  cyclobenzaprine (FLEXERIL) 10 MG tablet Take 1 tablet (10 mg total) by mouth 2 (two) times daily as needed for muscle spasms. 12/30/11  Yes Tatyana A Kirichenko, PA-C  dorzolamide-timolol (COSOPT) 22.3-6.8 MG/ML ophthalmic solution Place 1 drop into both eyes 2 (two) times daily.    Yes Historical Provider, MD  HYDROcodone-acetaminophen (NORCO) 10-325 MG per tablet Take 1 tablet by mouth every 8 (eight) hours as needed for moderate pain.   Yes Historical Provider, MD  ibuprofen (ADVIL,MOTRIN) 800 MG tablet Take 800 mg by mouth every 8 (eight) hours as needed for moderate pain.   Yes Historical Provider, MD  pregabalin (LYRICA) 225 MG capsule Take  225 mg by mouth 2 (two) times daily.   Yes Historical Provider, MD  diazepam (VALIUM) 5 MG tablet Take 1 tablet (5 mg total) by mouth every 6 (six) hours as needed for anxiety. Patient not taking: Reported on 05/16/2014 12/31/12   Nehemiah Settle A Upstill, PA-C  oxyCODONE-acetaminophen (PERCOCET/ROXICET) 5-325 MG per tablet Take 1 tablet by mouth every 4 (four) hours as needed for pain. Patient not taking: Reported on 05/16/2014 12/31/12   Dewaine Oats,  PA-C    Allergies:  No Known Allergies  Social History:  Ambulatory  independently  Lives at home  With family    reports that he has quit smoking. He has never used smokeless tobacco. He reports that he does not drink alcohol or use illicit drugs.    Family History: family history includes Cancer in his father and mother; Leukemia in his mother. There is no history of CAD or Diabetes type II.    Physical Exam: Patient Vitals for the past 24 hrs:  BP Temp Temp src Pulse Resp SpO2  05/16/14 2101 103/73 mmHg - - 74 16 93 %  05/16/14 2045 113/70 mmHg - - 84 15 92 %  05/16/14 2030 125/84 mmHg - - 72 17 97 %  05/16/14 1949 135/73 mmHg - - 73 12 100 %  05/16/14 1832 151/94 mmHg 97.6 F (36.4 C) Oral 85 18 100 %    1. General:  in No Acute distress 2. Psychological: Alert and Oriented 3. Head/ENT:   Moist  Mucous Membranes                          Head Non traumatic, neck supple                          Normal  Dentition 4. SKIN: normal Skin turgor,  Skin clean Dry and intact no rash 5. Heart: Regular rate and rhythm no Murmur, Rub or gallop 6. Lungs:  Distant BS, no wheezes or crackles   7. Abdomen: Soft, non-tender, Non distended 8. Lower extremities: no clubbing, cyanosis, or edema 9. Neurologically Grossly intact, moving all 4 extremities equally 10. MSK: Normal range of motion, no pain to palpation Strong pulses, good cap refill.   body mass index is unknown because there is no weight on file.   Labs on Admission:   Results for orders placed or performed during the hospital encounter of 05/16/14 (from the past 24 hour(s))  CBC with Differential     Status: Abnormal   Collection Time: 05/16/14  6:45 PM  Result Value Ref Range   WBC 6.3 4.0 - 10.5 K/uL   RBC 5.10 4.22 - 5.81 MIL/uL   Hemoglobin 14.6 13.0 - 17.0 g/dL   HCT 42.8 39.0 - 52.0 %   MCV 83.9 78.0 - 100.0 fL   MCH 28.6 26.0 - 34.0 pg   MCHC 34.1 30.0 - 36.0 g/dL   RDW 13.5 11.5 - 15.5 %   Platelets  215 150 - 400 K/uL   Neutrophils Relative % 46 43 - 77 %   Neutro Abs 3.0 1.7 - 7.7 K/uL   Lymphocytes Relative 37 12 - 46 %   Lymphs Abs 2.3 0.7 - 4.0 K/uL   Monocytes Relative 13 (H) 3 - 12 %   Monocytes Absolute 0.8 0.1 - 1.0 K/uL   Eosinophils Relative 3 0 - 5 %   Eosinophils Absolute 0.2 0.0 - 0.7 K/uL  Basophils Relative 1 0 - 1 %   Basophils Absolute 0.1 0.0 - 0.1 K/uL  Comprehensive metabolic panel     Status: Abnormal   Collection Time: 05/16/14  6:45 PM  Result Value Ref Range   Sodium 140 135 - 145 mmol/L   Potassium 3.7 3.5 - 5.1 mmol/L   Chloride 104 96 - 112 mmol/L   CO2 25 19 - 32 mmol/L   Glucose, Bld 112 (H) 70 - 99 mg/dL   BUN 13 6 - 23 mg/dL   Creatinine, Ser 0.91 0.50 - 1.35 mg/dL   Calcium 9.2 8.4 - 10.5 mg/dL   Total Protein 7.1 6.0 - 8.3 g/dL   Albumin 4.1 3.5 - 5.2 g/dL   AST 33 0 - 37 U/L   ALT 32 0 - 53 U/L   Alkaline Phosphatase 68 39 - 117 U/L   Total Bilirubin 0.5 0.3 - 1.2 mg/dL   GFR calc non Af Amer >90 >90 mL/min   GFR calc Af Amer >90 >90 mL/min   Anion gap 11 5 - 15  I-Stat Troponin, ED (not at [facility])     Status: None   Collection Time: 05/16/14  6:51 PM  Result Value Ref Range   Troponin i, poc 0.00 0.00 - 0.08 ng/mL   Comment 3            UA not obtained  No results found for: HGBA1C  CrCl cannot be calculated (Unknown ideal weight.).  BNP (last 3 results) No results for input(s): PROBNP in the last 8760 hours.  Other results:  I have pearsonaly reviewed this: ECG REPORT  Rate: 86  Rhythm: NSR ST&T Change: no ischemic changes   There were no vitals filed for this visit.   Cultures: No results found for: SDES, SPECREQUEST, CULT, REPTSTATUS   Radiological Exams on Admission: Dg Chest 2 View  05/16/2014   CLINICAL DATA:  Chest pain and shortness of Breath.  EXAM: CHEST  2 VIEW  COMPARISON:  07/19/2009  FINDINGS: The cardiac silhouette, mediastinal and hilar contours are normal and stable. The lungs are clear. No pleural  effusion. The bony thorax is intact. Stable surgical changes involving the left shoulder and cervical spine.  IMPRESSION: No acute cardiopulmonary findings.   Electronically Signed   By: Kalman Jewels M.D.   On: 05/16/2014 20:13    Chart has been reviewed  Assessment/Plan  59 yo M with hx of arthritis and ADHD here with left arm pain with radiation to left chest, and dyspnea for 6 months with past hx of tobacco abuse  Present on Admission:  . Chest pain - atypical - given risk factors will admit, monitor on telemetry, cycle cardiac enzymes, obtain serial ECG. Further risk stratify with lipid panel, hgA1C, obtain TSH. Make sure patient is on Aspirin. Further treatment based on the currently pending results. NPO post MN in case would need further cardiac work up . Left arm pain - Unclear etiology  Possibly musculoskeletal, symptomatic relief. Cardiac work up incase atypical cardiac angina equivalent.  Dyspnea - hx of tobacco abuse, abn chest exam , start nebulizer treatment and evaluate response, PFT as out patient would be beneficial.    Prophylaxis:   Lovenox,   CODE STATUS:  FULL CODE    Other plan as per orders.  I have spent a total of 55 min on this admission  Kaylob Wallen 05/16/2014, 9:30 PM  [facility]  Pager [phone]   after 2 AM please page floor coverage PA If  7AM-7PM, please contact the day team taking care of the patient  [email or web address]  Password [email or web address]

## 2014-05-16 NOTE — ED Notes (Signed)
Pt presents to department for evaluation of L sided chest pain radiating to L arm. 8/10 pain upon arrival to ED. Also states SOB. Respirations unlabored. Pt is alert and oriented x4.

## 2014-05-16 NOTE — ED Notes (Signed)
Attempted report 

## 2014-05-16 NOTE — ED Provider Notes (Signed)
CSN: [id number]     Arrival date & time 05/16/14  1823 History   First MD Initiated Contact with Patient 05/16/14 1943     Chief Complaint  Patient presents with  . Chest Pain  . Shortness of Breath     (Consider location/radiation/quality/duration/timing/severity/associated sxs/prior Treatment) Patient is a 59 y.o. male presenting with chest pain and shortness of breath. The history is provided by the patient.  Chest Pain Pain location:  L chest Pain quality: sharp   Pain radiates to:  L arm Pain radiates to the back: no   Pain severity:  Moderate Onset quality:  Gradual Timing:  Constant Progression:  Improving Chronicity:  New Context: at rest   Relieved by:  Nothing Worsened by:  Nothing tried Associated symptoms: shortness of breath (with exertion and when pain was radiating into his chest)   Associated symptoms: no cough, no diaphoresis, no fever, no nausea and not vomiting   Shortness of Breath Associated symptoms: chest pain   Associated symptoms: no cough, no diaphoresis, no fever and no vomiting     Past Medical History  Diagnosis Date  . Back pain   . Restless leg syndrome   . Glaucoma   . Hyperlipidemia   . Dermatitis     lower leg  . Spondylosis    Past Surgical History  Procedure Laterality Date  . Back surgery    . Knee surgery    . Shoulder surgery     Family History  Problem Relation Age of Onset  . Cancer Mother   . Cancer Father    History  Substance Use Topics  . Smoking status: Never Smoker   . Smokeless tobacco: Never Used  . Alcohol Use: No    Review of Systems  Constitutional: Negative for fever and diaphoresis.  Respiratory: Positive for shortness of breath (with exertion and when pain was radiating into his chest). Negative for cough.   Cardiovascular: Positive for chest pain.  Gastrointestinal: Negative for nausea and vomiting.  All other systems reviewed and are negative.     Allergies  Review of patient's allergies  indicates no known allergies.  Home Medications   Prior to Admission medications   Medication Sig Start Date End Date Taking? Authorizing Provider  atomoxetine (STRATTERA) 100 MG capsule Take 100 mg by mouth daily.   Yes Historical Provider, MD  bimatoprost (LUMIGAN) 0.03 % ophthalmic solution Place 1 drop into both eyes at bedtime.   Yes Historical Provider, MD  cyclobenzaprine (FLEXERIL) 10 MG tablet Take 1 tablet (10 mg total) by mouth 2 (two) times daily as needed for muscle spasms. 12/30/11  Yes Tatyana A Kirichenko, PA-C  dorzolamide-timolol (COSOPT) 22.3-6.8 MG/ML ophthalmic solution Place 1 drop into both eyes 2 (two) times daily.    Yes Historical Provider, MD  HYDROcodone-acetaminophen (NORCO) 10-325 MG per tablet Take 1 tablet by mouth every 8 (eight) hours as needed for moderate pain.   Yes Historical Provider, MD  ibuprofen (ADVIL,MOTRIN) 800 MG tablet Take 800 mg by mouth every 8 (eight) hours as needed for moderate pain.   Yes Historical Provider, MD  pregabalin (LYRICA) 225 MG capsule Take 225 mg by mouth 2 (two) times daily.   Yes Historical Provider, MD  diazepam (VALIUM) 5 MG tablet Take 1 tablet (5 mg total) by mouth every 6 (six) hours as needed for anxiety. Patient not taking: Reported on 05/16/2014 12/31/12   Melvenia BeamShari A Upstill, PA-C  oxyCODONE-acetaminophen (PERCOCET/ROXICET) 5-325 MG per tablet Take 1 tablet by mouth  every 4 (four) hours as needed for pain. Patient not taking: Reported on 05/16/2014 12/31/12   Melvenia Beam A Upstill, PA-C   BP 135/73 mmHg  Pulse 73  Temp(Src) 97.6 F (36.4 C) (Oral)  Resp 12  SpO2 100% Physical Exam  Constitutional: He is oriented to person, place, and time. He appears well-developed and well-nourished. No distress.  HENT:  Head: Normocephalic and atraumatic.  Mouth/Throat: No oropharyngeal exudate.  Eyes: EOM are normal. Pupils are equal, round, and reactive to light.  Neck: Normal range of motion. Neck supple.  Cardiovascular: Normal rate and  regular rhythm.  Exam reveals no friction rub.   No murmur heard. Pulmonary/Chest: Effort normal and breath sounds normal. No respiratory distress. He has no wheezes. He has no rales.  Abdominal: Soft. He exhibits no distension. There is no tenderness. There is no rebound.  Musculoskeletal: Normal range of motion. He exhibits no edema.  Neurological: He is alert and oriented to person, place, and time. No cranial nerve deficit. He exhibits normal muscle tone. Coordination normal.  Skin: No rash noted. He is not diaphoretic.  Nursing note and vitals reviewed.   ED Course  Procedures (including critical care time) Labs Review Labs Reviewed  CBC WITH DIFFERENTIAL/PLATELET - Abnormal; Notable for the following:    Monocytes Relative 13 (*)    All other components within normal limits  COMPREHENSIVE METABOLIC PANEL - Abnormal; Notable for the following:    Glucose, Bld 112 (*)    All other components within normal limits  I-STAT TROPOININ, ED    Imaging Review Dg Chest 2 View  05/16/2014   CLINICAL DATA:  Chest pain and shortness of Breath.  EXAM: CHEST  2 VIEW  COMPARISON:  07/19/2009  FINDINGS: The cardiac silhouette, mediastinal and hilar contours are normal and stable. The lungs are clear. No pleural effusion. The bony thorax is intact. Stable surgical changes involving the left shoulder and cervical spine.  IMPRESSION: No acute cardiopulmonary findings.   Electronically Signed   By: Loralie Champagne M.D.   On: 05/16/2014 20:13     EKG Interpretation   Date/Time:  Saturday May 16 2014 18:29:43 EST Ventricular Rate:  86 PR Interval:  182 QRS Duration: 94 QT Interval:  356 QTC Calculation: 426 R Axis:   -12 Text Interpretation:  Normal sinus rhythm Normal ECG No prior for  comparison Confirmed by Gwendolyn Grant  MD, Yeray Tomas ([id number]) on 05/16/2014 7:44:06 PM      MDM   Final diagnoses:  Chest pain, unspecified chest pain type    68M presents with L arm and L sided chest pain. Sharp,  began at rest today. Has noticed DOE for past 3 days when walking up stairs.  AFVSS here. No chest pain, but still having some L arm pain. EKG ok, troponin ok. Medicine consulted for admission.    Elwin Mocha, MD 05/17/14 [phone]

## 2014-05-17 ENCOUNTER — Other Ambulatory Visit: Payer: Self-pay

## 2014-05-17 DIAGNOSIS — E785 Hyperlipidemia, unspecified: Secondary | ICD-10-CM

## 2014-05-17 DIAGNOSIS — I361 Nonrheumatic tricuspid (valve) insufficiency: Secondary | ICD-10-CM

## 2014-05-17 DIAGNOSIS — I209 Angina pectoris, unspecified: Secondary | ICD-10-CM

## 2014-05-17 LAB — LIPID PANEL
Cholesterol: 181 mg/dL (ref 0–200)
HDL: 48 mg/dL (ref >39)
LDL Cholesterol: 114 mg/dL — ABNORMAL HIGH (ref 0–99)
Total CHOL/HDL Ratio: 3.8 RATIO
Triglycerides: 93 mg/dL (ref <150)
VLDL: 19 mg/dL (ref 0–40)

## 2014-05-17 LAB — TROPONIN I
Troponin I: <0.03 ng/mL (ref <0.031)
Troponin I: <0.03 ng/mL (ref <0.031)
Troponin I: <0.03 ng/mL (ref <0.031)

## 2014-05-17 LAB — BRAIN NATRIURETIC PEPTIDE: B Natriuretic Peptide: 11.4 pg/mL (ref 0.0–100.0)

## 2014-05-17 MED ORDER — SODIUM CHLORIDE 0.9 % IJ SOLN: 3.0000 mL | INTRAMUSCULAR | Status: DC | PRN

## 2014-05-17 MED ORDER — SODIUM CHLORIDE 0.9 % IJ SOLN
3.0000 mL | Freq: Two times a day (BID) | INTRAMUSCULAR | Status: DC
  Administered 2014-05-17 – 2014-05-18 (×2): 3 mL via INTRAVENOUS

## 2014-05-17 MED ORDER — ASPIRIN 81 MG PO CHEW
81.0000 mg | CHEWABLE_TABLET | ORAL | Status: AC
Start: 2014-05-18 — End: 2014-05-18
  Administered 2014-05-18: 81 mg via ORAL
  Filled 2014-05-17: qty 1

## 2014-05-17 MED ORDER — SODIUM CHLORIDE 0.9 % IV SOLN: 250.0000 mL | INTRAVENOUS | Status: DC | PRN

## 2014-05-17 NOTE — Progress Notes (Signed)
*  PRELIMINARY RESULTS* Echocardiogram 2D Echocardiogram has been performed.  Christopher Lee, Christopher Lee 05/17/2014, 11:03 AM

## 2014-05-17 NOTE — Progress Notes (Signed)
Utilization Review Completed.   Paiton Boultinghouse, RN, BSN Nurse Case Manager  

## 2014-05-17 NOTE — Consult Note (Signed)
Reason for Consult: Chest pain  Referring Physician: [name] is an 59 y.o. male.   HPI: The patient is a very pleasant 59 year old man who has a history of increasing shortness of breath over the past 6 months. He notes that a year ago, he could run 3-5 miles at a time. Over the last few weeks, he has noted progressive shortness of breath with exertion, such that he gets short of breath walking up a flight of stairs. Yesterday, he experienced left arm discomfort and chest discomfort with the arm discomfort radiating into the hand. It was relieved with nitroglycerin. He has ruled out for myocardial infarction. The patient has no family history of coronary disease. He has a history of smoking but stopped smoking approximately 10 years ago. He has normal left ventricular function by echo. Until yesterday, he had not had chest pain or arm pain.  PMH: Past Medical History  Diagnosis Date  . Back pain   . Restless leg syndrome   . Glaucoma   . Hyperlipidemia   . Dermatitis     lower leg  . Spondylosis     PSHX: Past Surgical History  Procedure Laterality Date  . Back surgery    . Knee surgery    . Shoulder surgery      FAMHX: Family History  Problem Relation Age of Onset  . Cancer Mother   . Leukemia Mother   . Cancer Father   . CAD Neg Hx   . Diabetes type II Neg Hx     Social History:  reports that he has quit smoking. He has never used smokeless tobacco. He reports that he does not drink alcohol or use illicit drugs.  Allergies: No Known Allergies  Medications: Reviewed  Dg Chest 2 View  05/16/2014   CLINICAL DATA:  Chest pain and shortness of Breath.  EXAM: CHEST  2 VIEW  COMPARISON:  07/19/2009  FINDINGS: The cardiac silhouette, mediastinal and hilar contours are normal and stable. The lungs are clear. No pleural effusion. The bony thorax is intact. Stable surgical changes involving the left shoulder and cervical spine.  IMPRESSION: No acute  cardiopulmonary findings.   Electronically Signed   By: Loralie Champagne M.D.   On: 05/16/2014 20:13    ROS  As stated in the HPI and negative for all other systems.  Physical Exam  Vitals:Blood pressure 128/85, pulse 67, temperature 97.6 F (36.4 C), temperature source Oral, resp. rate 18, height  (1.727 m), weight 218 lb 12.8 oz (99.247 kg), SpO2 95 %.  Well appearing middle-aged man, NAD HEENT: Unremarkable Neck:  No JVD, no thyromegally Back:  No CVA tenderness Lungs:  Clear with no wheezes, rales, or rhonchi. HEART:  Regular rate rhythm, no murmurs, no rubs, no clicks Abd:  soft, positive bowel sounds, no organomegally, no rebound, no guarding Ext:  2 plus pulses, no edema, no cyanosis, no clubbing Skin:  No rashes no nodules Neuro:  CN II through XII intact, motor grossly intact  ECG - normal sinus rhythm , there is a question of 1 mm ST elevation in lead V6   Assessment/Plan: 1. Chest pain with typical components for angina   2. Worsening ability to exercise secondary to shortness of breath 3. History of tobacco abuse Discussion: The patient has multiple risk factors for coronary disease, and has had a story worrisome for coronary disease. Either he has severe blockages or he has occult lung disease. We'll plan to proceed  with right and left heart catheterization. We will attempt to do this tomorrow.  Sharlot GowdaGregg TaylorMD 05/17/2014, 2:07 PM

## 2014-05-17 NOTE — Progress Notes (Signed)
[facility] PROGRESS NOTE  Christopher Lee [id number] DOB: [birth year] DOA: 05/16/2014 PCP: Lujean Amel, MD  Assessment/Plan: 1. Chest pain 1. Cardiac enzymes neg x 3 2. 2d echo done, awaiting results 3. EKG with NSR, unremarkable 4. Pt's symptoms are concerning in that pt reports chest pain with radiation to the L arm with increased dyspnea, worse with exertion 5. No family hx for CAD or stroke, although pt does have a remote hx of tobacco abuse and has a hx of HLD, correlates to HEART score of over 3-4 6. Have discussed case with Cardiology who will evaluate patient. ?will he benefit from a stress test either as inpt or outpatient? 2. HLD 1. LDL of 114, not on statin 3. Dyspnea 1. Unclear etiology 2. CXR reviewed. Clear 3. Await 2d echo findings 4. Have ordered BNP, but no LE edema or signs of vol overload 5. On minimal o2 support 4. Spondylosis 1. Stable at present 5. DVT prophylaxis 1. Lovenox subQ  Code Status: Full Family Communication: Pt in room, wife at bedside Disposition Plan: Pending   Consultants:  Cardiology  Procedures:    Antibiotics:    HPI/Subjective: Chest pain earlier this AM, since resolved. Complains of mild dyspnea on mild exertion that has been noted over the past month  Objective: Filed Vitals:   05/17/14 0048 05/17/14 0524 05/17/14 1009 05/17/14 1041  BP: 127/80 165/83 127/78 128/85  Pulse: 66 69 65 67  Temp: 97.3 F (36.3 C) 97.6 F (36.4 C)    TempSrc: Oral Oral    Resp: 18 18    Height:      Weight:  99.247 kg (218 lb 12.8 oz)    SpO2: 96% 96%  95%    Intake/Output Summary (Last 24 hours) at 05/17/14 1202 Last data filed at 05/17/14 0946  Gross per 24 hour  Intake    240 ml  Output   1550 ml  Net  -1310 ml   Filed Weights   05/16/14 2216 05/17/14 0524  Weight: 100.789 kg (222 lb 3.2 oz) 99.247 kg (218 lb 12.8 oz)    Exam:   General:  Awake, in nad  Cardiovascular: Regular, s1, s2  Respiratory:  normal resp effort, no wheezing  Abdomen: soft,nondistended  Musculoskeletal: perfused, no clubbing   Data Reviewed: Basic Metabolic Panel:  Recent Labs Lab 05/16/14 1845  NA 140  K 3.7  CL 104  CO2 25  GLUCOSE 112*  BUN 13  CREATININE 0.91  CALCIUM 9.2   Liver Function Tests:  Recent Labs Lab 05/16/14 1845  AST 33  ALT 32  ALKPHOS 68  BILITOT 0.5  PROT 7.1  ALBUMIN 4.1   No results for input(s): LIPASE, AMYLASE in the last 168 hours. No results for input(s): AMMONIA in the last 168 hours. CBC:  Recent Labs Lab 05/16/14 1845  WBC 6.3  NEUTROABS 3.0  HGB 14.6  HCT 42.8  MCV 83.9  PLT 215   Cardiac Enzymes:  Recent Labs Lab 05/17/14 0244 05/17/14 0544 05/17/14 0847  TROPONINI <0.03 <0.03 <0.03   BNP (last 3 results) No results for input(s): BNP in the last 8760 hours.  ProBNP (last 3 results) No results for input(s): PROBNP in the last 8760 hours.  CBG: No results for input(s): GLUCAP in the last 168 hours.  No results found for this or any previous visit (from the past 240 hour(s)).   Studies: Dg Chest 2 View  05/16/2014   CLINICAL DATA:  Chest pain and shortness of Breath.  EXAM: CHEST  2 VIEW  COMPARISON:  07/19/2009  FINDINGS: The cardiac silhouette, mediastinal and hilar contours are normal and stable. The lungs are clear. No pleural effusion. The bony thorax is intact. Stable surgical changes involving the left shoulder and cervical spine.  IMPRESSION: No acute cardiopulmonary findings.   Electronically Signed   By: Kalman Jewels M.D.   On: 05/16/2014 20:13    Scheduled Meds: . dorzolamide-timolol  1 drop Both Eyes BID  . enoxaparin (LOVENOX) injection  40 mg Subcutaneous Q24H  . latanoprost  1 drop Both Eyes QHS  . pregabalin  225 mg Oral BID   Continuous Infusions:   Active Problems:   Chest pain   Left arm pain   Dyspnea   [name], [facility] Pager [phone]. If 7PM-7AM, please contact night-coverage at  [email or web address], password [facility] 05/17/2014, 12:02 PM  LOS: 1 day

## 2014-05-17 NOTE — Progress Notes (Signed)
Pt complained opf 4/10 CP, morphine given IV, now pain is 3/10. VSS, EKG done

## 2014-05-18 ENCOUNTER — Encounter (HOSPITAL_COMMUNITY)
Admission: EM | Disposition: A | Discharge status: Home / Self Care | Payer: Self-pay | Source: Home / Self Care | Attending: Emergency Medicine

## 2014-05-18 DIAGNOSIS — R079 Chest pain, unspecified: Secondary | ICD-10-CM | POA: Insufficient documentation

## 2014-05-18 DIAGNOSIS — R739 Hyperglycemia, unspecified: Secondary | ICD-10-CM

## 2014-05-18 DIAGNOSIS — I200 Unstable angina: Secondary | ICD-10-CM

## 2014-05-18 DIAGNOSIS — I509 Heart failure, unspecified: Secondary | ICD-10-CM

## 2014-05-18 HISTORY — PX: LEFT HEART CATHETERIZATION WITH CORONARY ANGIOGRAM: SHX5451

## 2014-05-18 LAB — HEMOGLOBIN A1C
Hgb A1c MFr Bld: 5.9 % — ABNORMAL HIGH (ref 4.8–5.6)
Mean Plasma Glucose: 123 mg/dL

## 2014-05-18 LAB — PROTIME-INR
INR: 0.94 (ref 0.00–1.49)
Prothrombin Time: 12.7 seconds (ref 11.6–15.2)

## 2014-05-18 SURGERY — LEFT HEART CATHETERIZATION WITH CORONARY ANGIOGRAM
Anesthesia: LOCAL

## 2014-05-18 MED ORDER — LIDOCAINE HCL (PF) 1 % IJ SOLN
INTRAMUSCULAR | Status: AC
  Filled 2014-05-18: qty 30

## 2014-05-18 MED ORDER — SODIUM CHLORIDE 0.9 % IV SOLN: INTRAVENOUS | Status: AC

## 2014-05-18 MED ORDER — ASPIRIN 81 MG PO CHEW
81.0000 mg | CHEWABLE_TABLET | Freq: Every day | ORAL | Status: DC
  Administered 2014-05-19: 81 mg via ORAL
  Filled 2014-05-18: qty 1

## 2014-05-18 MED ORDER — FENTANYL CITRATE 0.05 MG/ML IJ SOLN
INTRAMUSCULAR | Status: AC
  Filled 2014-05-18: qty 2

## 2014-05-18 MED ORDER — ONDANSETRON HCL 4 MG/2ML IJ SOLN: 4.0000 mg | Freq: Four times a day (QID) | INTRAMUSCULAR | Status: DC | PRN

## 2014-05-18 MED ORDER — MORPHINE SULFATE 2 MG/ML IJ SOLN: 2.0000 mg | INTRAMUSCULAR | Status: DC | PRN

## 2014-05-18 MED ORDER — NITROGLYCERIN 1 MG/10 ML FOR IR/CATH LAB
INTRA_ARTERIAL | Status: AC
  Filled 2014-05-18: qty 10

## 2014-05-18 MED ORDER — HEPARIN (PORCINE) IN NACL 2-0.9 UNIT/ML-% IJ SOLN
INTRAMUSCULAR | Status: AC
  Filled 2014-05-18: qty 1000

## 2014-05-18 MED ORDER — MIDAZOLAM HCL 2 MG/2ML IJ SOLN
INTRAMUSCULAR | Status: AC
  Filled 2014-05-18: qty 2

## 2014-05-18 MED ORDER — SODIUM CHLORIDE 0.9 % IV SOLN: INTRAVENOUS | Status: DC

## 2014-05-18 MED ORDER — ACETAMINOPHEN 325 MG PO TABS: 650.0000 mg | ORAL_TABLET | ORAL | Status: DC | PRN

## 2014-05-18 NOTE — CV Procedure (Signed)
Christopher Lee is a 59 y.o. male    VY:[id number] LOCATION:  FACILITY: [facility]  PHYSICIAN: Quay Burow, M.D. 29-Mar-1956   DATE OF PROCEDURE:  05/18/2014  DATE OF DISCHARGE:     CARDIAC CATHETERIZATION     History obtained from chart review.Christopher Lee s a 59 year old married Caucasian male admitted on 05/16/14 with unstable angina and congestive heart failure symptoms. He ruled out for myocardial infarction. He's been having progressive dyspnea over the last year with recent episode of chest pain and a prolonged episode with left upper extremity radiation that led to admission. I 2-D echo was normal. He presents now for right and left heart cath to define his anatomy and rule out an ischemic etiology.   PROCEDURE DESCRIPTION:   The patient was brought to the second floor [facility] cath lab in the postabsorptive state. He was premedicated with Valium 5 mg by mouth, IV Versed and fentanyl. His right groin was prepped and shaved in usual sterile fashion. Xylocaine 1% was used for local anesthesia. A 5 French sheath was inserted into the right common femoral artery using standard Seldinger technique. A 7 French sheath was inserted into the right common femoral vein. A 7 French balloon tip thermal dilution Swan-Ganz catheter was advanced to the right heart chambers obtaining sequential pressures. 5 French right and left Judkins diagnostic catheters along with a 5 French pigtail catheter were used for selective coronary angiography and left ventriculography respectively. Visipaque dye was used for the entirety of the case. Retrograde aortic, left ventricular and pullback pressures were recorded.   HEMODYNAMICS:    AO SYSTOLIC/AO DIASTOLIC: [id number]   LV SYSTOLIC/LV DIASTOLIC: [id number]  Right atrial pressure: 6/7  Right ventricular pressure: 27/6  Pulmonary artery pressure: 26/14  Pulmonary artery wedge pressure: 10/8, mean 8  ANGIOGRAPHIC RESULTS:   1. Left main; normal    2. LAD; normal 3. Left circumflex; dominant and normal.  4. Right coronary artery; nondominant and normal 5. Left ventriculography; RAO left ventriculogram was performed using  25 mL of Visipaque dye at 12 mL/second. The overall LVEF estimated  55-60 %  Without wall motion abnormalities  IMPRESSION:[name]  has normal coronary arteries, normal LV function and normal filling pressures.I do not think his chest pain shortness of breath was coming from his heart. Other etiologies will need to be considered. This sheaths were removed and pressure was held on the groin to achieve hemostasis. The patient left the lab in stable condition.  Lorretta Harp MD, [facility] 05/18/2014 4:56 PM

## 2014-05-18 NOTE — Progress Notes (Signed)
UR completed 

## 2014-05-18 NOTE — Progress Notes (Signed)
 Patient Name: Christopher Lee Date of Encounter: 05/18/2014     Active Problems:   Chest pain   Left arm pain   Dyspnea    SUBJECTIVE  Some CP last night. Still with SOB.   CURRENT MEDS . dorzolamide-timolol  1 drop Both Eyes BID  . enoxaparin (LOVENOX) injection  40 mg Subcutaneous Q24H  . latanoprost  1 drop Both Eyes QHS  . pregabalin  225 mg Oral BID  . sodium chloride  3 mL Intravenous Q12H    OBJECTIVE  Filed Vitals:   05/17/14 1041 05/17/14 1456 05/17/14 2102 05/18/14 0529  BP: 128/85 113/78 110/67 124/74  Pulse: 67 73 76 69  Temp:  98 F (36.7 C) 97.8 F (36.6 C)   TempSrc:  Oral Oral Oral  Resp:  20 18 18  Height:      Weight:    218 lb 9.6 oz (99.156 kg)  SpO2: 95% 98% 94% 96%    Intake/Output Summary (Last 24 hours) at 05/18/14 0912 Last data filed at 05/18/14 0544  Gross per 24 hour  Intake    480 ml  Output   1925 ml  Net  -1445 ml   Filed Weights   05/16/14 2216 05/17/14 0524 05/18/14 0529  Weight: 222 lb 3.2 oz (100.789 kg) 218 lb 12.8 oz (99.247 kg) 218 lb 9.6 oz (99.156 kg)    PHYSICAL EXAM  General: Pleasant, NAD. Neuro: Alert and oriented X 3. Moves all extremities spontaneously. Psych: Normal affect. HEENT:  Normal  Neck: Supple without bruits or JVD. Lungs:  Resp regular and unlabored, CTA. Heart: RRR no s3, s4, or murmurs. Abdomen: Soft, non-tender, non-distended, BS + x 4.  Extremities: No clubbing, cyanosis or edema. DP/PT/Radials 2+ and equal bilaterally.  Accessory Clinical Findings  CBC  Recent Labs  05/16/14 1845  WBC 6.3  NEUTROABS 3.0  HGB 14.6  HCT 42.8  MCV 83.9  PLT 215   Basic Metabolic Panel  Recent Labs  05/16/14 1845  NA 140  K 3.7  CL 104  CO2 25  GLUCOSE 112*  BUN 13  CREATININE 0.91  CALCIUM 9.2   Liver Function Tests  Recent Labs  05/16/14 1845  AST 33  ALT 32  ALKPHOS 68  BILITOT 0.5  PROT 7.1  ALBUMIN 4.1   No results for input(s): LIPASE, AMYLASE in the last 72  hours. Cardiac Enzymes  Recent Labs  05/17/14 0244 05/17/14 0544 05/17/14 0847  TROPONINI <0.03 <0.03 <0.03   Fasting Lipid Panel  Recent Labs  05/17/14 0244  CHOL 181  HDL 48  LDLCALC 114*  TRIG 93  CHOLHDL 3.8    TELE  NSR.   Radiology/Studies  Dg Chest 2 View  05/16/2014   CLINICAL DATA:  Chest pain and shortness of Breath.  EXAM: CHEST  2 VIEW  COMPARISON:  07/19/2009  FINDINGS: The cardiac silhouette, mediastinal and hilar contours are normal and stable. The lungs are clear. No pleural effusion. The bony thorax is intact. Stable surgical changes involving the left shoulder and cervical spine.  IMPRESSION: No acute cardiopulmonary findings.   Electronically Signed   By: Mark  Gallerani M.D.   On: 05/16/2014 20:13   2D ECHO LV EF: 55% -   60% Study Conclusions - Left ventricle: The cavity size was normal. Systolic function was   normal. The estimated ejection fraction was in the range of 55%   to 60%. Regional wall motion abnormalities cannot be excluded.   Doppler parameters are   consistent with abnormal left ventricular   relaxation (grade 1 diastolic dysfunction). There was no evidence   of elevated ventricular filling pressure by Doppler parameters. - Aortic valve: There was trivial regurgitation. - Aortic root: The aortic root was normal in size. - Mitral valve: Structurally normal valve. There was no   regurgitation. - Right ventricle: The cavity size was normal. Wall thickness was   normal. Systolic function was normal. - Right atrium: The atrium was normal in size. - Tricuspid valve: There was mild regurgitation. - Pulmonary arteries: The main pulmonary artery was normal-sized.   Systolic pressure was within the normal range. - Inferior vena cava: The vessel was normal in size. - Pericardium, extracardiac: There was no pericardial effusion.  ECG - normal sinus rhythm , there is a question of 1 mm ST elevation in lead V6   ASSESSMENT AND PLAN  Christopher  R Lee is a 59 y.o. male with a history of HLD, obesity, hx of tobacco abuse and no prior cardiac history who presented to [facility] on 05/16/14 with atypical chest pain/left arm pain and SOB for 6 months  Chest pain/L arm pain- both typical and atypical- exertional, relieved by NTG.  He does have RFs including obesity, HLD and hx of tobacco abuse -- Troponin neg x3. ECG with no acute ST or TW changes -- 2D ECHO with EF 55-60%, RWMAs cannot be excluded, G1DD, mild TR -- Plan for L/RHC today  Dyspnea- progressive SOB with exertion.  if L/RHC unrevealing, would consider pulmonary work up. 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   Hyperglycemia- HgA1C pending  Signed, THOMPSON, KATHRYN R PA-C  Pager [phone]  Patient seen and examined and history reviewed. Agree with above findings and plan. Feeling OK now. Presents with worsening dyspnea for 8 months. Development of chest pain is new and concerning for unstable angina. EF normal by Echo. Plan right and left heart cath today. Questions answered about procedure.   Shae Augello, MDFACC 05/18/2014 1:08 PM    

## 2014-05-18 NOTE — Interval H&P Note (Signed)
Cath Lab Visit (complete for each Cath Lab visit)  Clinical Evaluation Leading to the Procedure:   ACS: Yes.    Non-ACS:    Anginal Classification: CCS IV  Anti-ischemic medical therapy: No Therapy  Non-Invasive Test Results: No non-invasive testing performed  Prior CABG: No previous CABG      History and Physical Interval Note:  05/18/2014 4:51 PM  Esperanza RichtersStephen R Pucci  has presented today for surgery, with the diagnosis of cp  The various methods of treatment have been discussed with the patient and family. After consideration of risks, benefits and other options for treatment, the patient has consented to  Procedure(s): LEFT HEART CATHETERIZATION WITH CORONARY ANGIOGRAM (N/A) as a surgical intervention .  The patient's history has been reviewed, patient examined, no change in status, stable for surgery.  I have reviewed the patient's chart and labs.  Questions were answered to the patient's satisfaction.     Runell GessBERRY,Lilliah Priego J

## 2014-05-18 NOTE — Progress Notes (Signed)
Site area: RFA/RFV Site Prior to Removal:  Level 0 Pressure Applied For:7625min Manual:   yes Patient Status During Pull:  stable Post Pull Site:  Level 0 Post Pull Instructions Given:  yes Post Pull Pulses Present: palpable Dressing Applied:  clear Bedrest begins @ 1745 Comments:

## 2014-05-18 NOTE — H&P (View-Only) (Signed)
Patient Name: Christopher Lee Date of Encounter: 05/18/2014     Active Problems:   Chest pain   Left arm pain   Dyspnea    SUBJECTIVE  Some CP last night. Still with SOB.   CURRENT MEDS . dorzolamide-timolol  1 drop Both Eyes BID  . enoxaparin (LOVENOX) injection  40 mg Subcutaneous Q24H  . latanoprost  1 drop Both Eyes QHS  . pregabalin  225 mg Oral BID  . sodium chloride  3 mL Intravenous Q12H    OBJECTIVE  Filed Vitals:   05/17/14 1041 05/17/14 1456 05/17/14 2102 05/18/14 0529  BP: 128/85 113/78 110/67 124/74  Pulse: 67 73 76 69  Temp:  98 F (36.7 C) 97.8 F (36.6 C)   TempSrc:  Oral Oral Oral  Resp:  '20 18 18  '$ Height:      Weight:    218 lb 9.6 oz (99.156 kg)  SpO2: 95% 98% 94% 96%    Intake/Output Summary (Last 24 hours) at 05/18/14 0912 Last data filed at 05/18/14 0544  Gross per 24 hour  Intake    480 ml  Output   1925 ml  Net  -1445 ml   Filed Weights   05/16/14 2216 05/17/14 0524 05/18/14 0529  Weight: 222 lb 3.2 oz (100.789 kg) 218 lb 12.8 oz (99.247 kg) 218 lb 9.6 oz (99.156 kg)    PHYSICAL EXAM  General: Pleasant, NAD. Neuro: Alert and oriented X 3. Moves all extremities spontaneously. Psych: Normal affect. HEENT:  Normal  Neck: Supple without bruits or JVD. Lungs:  Resp regular and unlabored, CTA. Heart: RRR no s3, s4, or murmurs. Abdomen: Soft, non-tender, non-distended, BS + x 4.  Extremities: No clubbing, cyanosis or edema. DP/PT/Radials 2+ and equal bilaterally.  Accessory Clinical Findings  CBC  Recent Labs  05/16/14 1845  WBC 6.3  NEUTROABS 3.0  HGB 14.6  HCT 42.8  MCV 83.9  PLT 123456   Basic Metabolic Panel  Recent Labs  05/16/14 1845  NA 140  K 3.7  CL 104  CO2 25  GLUCOSE 112*  BUN 13  CREATININE 0.91  CALCIUM 9.2   Liver Function Tests  Recent Labs  05/16/14 1845  AST 33  ALT 32  ALKPHOS 68  BILITOT 0.5  PROT 7.1  ALBUMIN 4.1   No results for input(s): LIPASE, AMYLASE in the last 72  hours. Cardiac Enzymes  Recent Labs  05/17/14 0244 05/17/14 0544 05/17/14 0847  TROPONINI <0.03 <0.03 <0.03   Fasting Lipid Panel  Recent Labs  05/17/14 0244  CHOL 181  HDL 48  LDLCALC 114*  TRIG 93  CHOLHDL 3.8    TELE  NSR.   Radiology/Studies  Dg Chest 2 View  05/16/2014   CLINICAL DATA:  Chest pain and shortness of Breath.  EXAM: CHEST  2 VIEW  COMPARISON:  07/19/2009  FINDINGS: The cardiac silhouette, mediastinal and hilar contours are normal and stable. The lungs are clear. No pleural effusion. The bony thorax is intact. Stable surgical changes involving the left shoulder and cervical spine.  IMPRESSION: No acute cardiopulmonary findings.   Electronically Signed   By: Kalman Jewels M.D.   On: 05/16/2014 20:13   2D ECHO LV EF: 55% -   60% Study Conclusions - Left ventricle: The cavity size was normal. Systolic function was   normal. The estimated ejection fraction was in the range of 55%   to 60%. Regional wall motion abnormalities cannot be excluded.   Doppler parameters are  consistent with abnormal left ventricular   relaxation (grade 1 diastolic dysfunction). There was no evidence   of elevated ventricular filling pressure by Doppler parameters. - Aortic valve: There was trivial regurgitation. - Aortic root: The aortic root was normal in size. - Mitral valve: Structurally normal valve. There was no   regurgitation. - Right ventricle: The cavity size was normal. Wall thickness was   normal. Systolic function was normal. - Right atrium: The atrium was normal in size. - Tricuspid valve: There was mild regurgitation. - Pulmonary arteries: The main pulmonary artery was normal-sized.   Systolic pressure was within the normal range. - Inferior vena cava: The vessel was normal in size. - Pericardium, extracardiac: There was no pericardial effusion.  ECG - normal sinus rhythm , there is a question of 1 mm ST elevation in lead V6   ASSESSMENT AND PLAN  Christopher Lee is a 59 y.o. male with a history of HLD, obesity, hx of tobacco abuse and no prior cardiac history who presented to [facility] on 05/16/14 with atypical chest pain/left arm pain and SOB for 6 months  Chest pain/L arm pain- both typical and atypical- exertional, relieved by NTG.  He does have RFs including obesity, HLD and hx of tobacco abuse -- Troponin neg x3. ECG with no acute ST or TW changes -- 2D ECHO with EF 55-60%, RWMAs cannot be excluded, G1DD, mild TR -- Plan for [facility] today  Dyspnea- progressive SOB with exertion.  if L/RHC unrevealing, would consider pulmonary work up. 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   Hyperglycemia- HgA1C pending  Judy Pimple PA-C  Pager [phone]  Patient seen and examined and history reviewed. Agree with above findings and plan. Feeling OK now. Presents with worsening dyspnea for 8 months. Development of chest pain is new and concerning for unstable angina. EF normal by Echo. Plan right and left heart cath today. Questions answered about procedure.   Brydon Spahr [place], [place] 05/18/2014 1:08 PM

## 2014-05-18 NOTE — Progress Notes (Signed)
[facility] PROGRESS NOTE  Christopher Lee [id number] DOB: 1955-06-15 DOA: 05/16/2014 PCP: Lujean Amel, MD  Assessment/Plan: 1. Chest pain 1. Cardiac enzymes neg x 3 2. 2d echo done, awaiting results 3. EKG with NSR, unremarkable 4. Pt's symptoms were concerning in that pt reports chest pain with radiation to the L arm with increased dyspnea, worse with exertion 5. Cardiology was consulted and pt underwent cath on 2/8 with normal coronary arteries with normal LV function and normal filling pressures 2. HLD 1. LDL of 114, not on statin 3. Dyspnea 1. Noted along with decreased exercise tolerance 2. Unclear etiology 3. CXR reviewed. Clear 4. 2d echo unremarkable with normal cath 5. On minimal o2 support 6. Pt reports feeling better today 7. Will check TSH, testosterone, cortisol 4. Spondylosis 1. Stable at present 5. DVT prophylaxis 1. Lovenox subQ  Code Status: Full Family Communication: Pt in room, wife at bedside Disposition Plan: Pending   Consultants:  Cardiology  Procedures:  Cath 2/8  Antibiotics:  none  HPI/Subjective: Reports feeling better today. No chest pains  Objective: Filed Vitals:   05/18/14 1730 05/18/14 1735 05/18/14 1740 05/18/14 1745  BP: 131/63 129/74 129/74 138/69  Pulse: 76 79 75 78  Temp:      TempSrc:      Resp: '15 22 16 16  '$ Height:      Weight:      SpO2: 95% 95% 95% 94%    Intake/Output Summary (Last 24 hours) at 05/18/14 1757 Last data filed at 05/18/14 1600  Gross per 24 hour  Intake  292.5 ml  Output    775 ml  Net -482.5 ml   Filed Weights   05/16/14 2216 05/17/14 0524 05/18/14 0529  Weight: 100.789 kg (222 lb 3.2 oz) 99.247 kg (218 lb 12.8 oz) 99.156 kg (218 lb 9.6 oz)    Exam:   General:  Awake, laying in bed, in nad  Cardiovascular: Regular, s1, s2  Respiratory: normal resp effort, no wheezing  Abdomen: soft,nondistended  Musculoskeletal: perfused, no clubbing   Data Reviewed: Basic  Metabolic Panel:  Recent Labs Lab 05/16/14 1845  NA 140  K 3.7  CL 104  CO2 25  GLUCOSE 112*  BUN 13  CREATININE 0.91  CALCIUM 9.2   Liver Function Tests:  Recent Labs Lab 05/16/14 1845  AST 33  ALT 32  ALKPHOS 68  BILITOT 0.5  PROT 7.1  ALBUMIN 4.1   No results for input(s): LIPASE, AMYLASE in the last 168 hours. No results for input(s): AMMONIA in the last 168 hours. CBC:  Recent Labs Lab 05/16/14 1845  WBC 6.3  NEUTROABS 3.0  HGB 14.6  HCT 42.8  MCV 83.9  PLT 215   Cardiac Enzymes:  Recent Labs Lab 05/17/14 0244 05/17/14 0544 05/17/14 0847  TROPONINI <0.03 <0.03 <0.03   BNP (last 3 results)  Recent Labs  05/17/14 1330  BNP 11.4    ProBNP (last 3 results) No results for input(s): PROBNP in the last 8760 hours.  CBG: No results for input(s): GLUCAP in the last 168 hours.  No results found for this or any previous visit (from the past 240 hour(s)).   Studies: Dg Chest 2 View  05/16/2014   CLINICAL DATA:  Chest pain and shortness of Breath.  EXAM: CHEST  2 VIEW  COMPARISON:  07/19/2009  FINDINGS: The cardiac silhouette, mediastinal and hilar contours are normal and stable. The lungs are clear. No pleural effusion. The bony thorax is intact. Stable surgical changes involving  the left shoulder and cervical spine.  IMPRESSION: No acute cardiopulmonary findings.   Electronically Signed   By: Kalman Jewels M.D.   On: 05/16/2014 20:13    Scheduled Meds: . dorzolamide-timolol  1 drop Both Eyes BID  . enoxaparin (LOVENOX) injection  40 mg Subcutaneous Q24H  . latanoprost  1 drop Both Eyes QHS  . pregabalin  225 mg Oral BID  . sodium chloride  3 mL Intravenous Q12H   Continuous Infusions: . sodium chloride 75 mL/hr at 05/18/14 1518  . sodium chloride 640 mL (05/18/14 1711)    Active Problems:   Chest pain   Left arm pain   Dyspnea   Pain in the chest   Yukiko Minnich, [place] [facility] Pager [phone]. If 7PM-7AM, please contact  night-coverage at [email or web address], password [facility] 05/18/2014, 5:57 PM  LOS: 2 days

## 2014-05-19 ENCOUNTER — Encounter (HOSPITAL_COMMUNITY): Payer: Self-pay | Admitting: Cardiovascular Disease

## 2014-05-19 ENCOUNTER — Observation Stay (HOSPITAL_COMMUNITY): Payer: BLUE CROSS/BLUE SHIELD

## 2014-05-19 DIAGNOSIS — R0789 Other chest pain: Principal | ICD-10-CM

## 2014-05-19 DIAGNOSIS — R0600 Dyspnea, unspecified: Secondary | ICD-10-CM

## 2014-05-19 LAB — BASIC METABOLIC PANEL
Anion gap: 9 (ref 5–15)
BUN: 14 mg/dL (ref 6–23)
CO2: 24 mmol/L (ref 19–32)
Calcium: 8.6 mg/dL (ref 8.4–10.5)
Chloride: 105 mmol/L (ref 96–112)
Creatinine, Ser: 0.75 mg/dL (ref 0.50–1.35)
GFR calc Af Amer: >90 mL/min (ref >90)
GFR calc non Af Amer: >90 mL/min (ref >90)
Glucose, Bld: 102 mg/dL — ABNORMAL HIGH (ref 70–99)
Potassium: 3.8 mmol/L (ref 3.5–5.1)
Sodium: 138 mmol/L (ref 135–145)

## 2014-05-19 LAB — D-DIMER, QUANTITATIVE: D-Dimer, Quant: 1.09 ug/mL-FEU — ABNORMAL HIGH (ref 0.00–0.48)

## 2014-05-19 LAB — TSH: TSH: 3.275 u[IU]/mL (ref 0.350–4.500)

## 2014-05-19 LAB — CORTISOL: Cortisol, Plasma: 6 ug/dL

## 2014-05-19 MED ORDER — IOHEXOL 350 MG/ML SOLN
80.0000 mL | Freq: Once | INTRAVENOUS | Status: AC | PRN
  Administered 2014-05-19: 80 mL via INTRAVENOUS

## 2014-05-19 NOTE — Progress Notes (Signed)
UR completed 

## 2014-05-19 NOTE — Progress Notes (Signed)
1830 all d/cinstructions explained and given to pt.  Verbalized understanding.  D/c  Off floor via w/c to awaiting transport.  Amanda PeaNellie Cortasia Screws, Charity fundraiserN.

## 2014-05-19 NOTE — Discharge Summary (Signed)
Physician Discharge Summary  ADRIELL POLANSKY WUJ:[id number] DOB: Dec 09, 1955 DOA: 05/16/2014  PCP: Darrow Bussing, MD  Admit date: 05/16/2014 Discharge date: 05/19/2014  Time spent: 25 minutes  Recommendations for Outpatient Follow-up:  1. Follow up with PCP in 1-2 weeks 2. Consider follow up with Pulmonary as an outpatient 3. Follow up testosterone level obtained on 2/9  Discharge Diagnoses:  Active Problems:   Chest pain   Left arm pain   Dyspnea   Pain in the chest   Discharge Condition: Stable  Diet recommendation: Regular  Filed Weights   05/17/14 0524 05/18/14 0529 05/19/14 0546  Weight: 99.247 kg (218 lb 12.8 oz) 99.156 kg (218 lb 9.6 oz) 98.748 kg (217 lb 11.2 oz)    History of present illness:  Please refer to h an p from 2/6 for details. Briefly, pt presents with chest pain, sob with radiation to L arm. Pt was admitted for further work up.  Hospital Course:  1. Chest pain 1. Cardiac enzymes neg x 3 2. 2d echo done, unremarkable 3. EKG with NSR, unremarkable 4. Pt's presenting symptoms were concerning in that pt reports chest pain with radiation to the L arm with increased dyspnea, worse with exertion 5. Cardiology was consulted and pt underwent cath on 2/8 with normal coronary arteries with normal LV function and normal filling pressures 6. Pt has remained stable and pain free through the remainder of the hospital stay 7. D-dimer was noted to be elevated at 1.09. Follow CTA chest was neg for PE 2. HLD 1. LDL of 114, not on statin 3. Dyspnea 1. Noted along with decreased exercise tolerance 2. Unclear etiology 3. CXR reviewed. Clear 4. 2d echo unremarkable with normal cath 5. On minimal o2 support 6. Pt reports feeling better today 7. TSH, cortisol normal 8. Serum testosterone pending still, would have PCP follow up result 4. Spondylosis 1. Stable at present 5. DVT prophylaxis 1. Lovenox subQ  Procedures:  Heart cath 2/8 - normal  coronaries  Consultations:  Cardiology  Discharge Exam: Filed Vitals:   05/18/14 2002 05/18/14 2118 05/19/14 0202 05/19/14 0546  BP: 123/57 140/59 108/70 111/71  Pulse: 88 82 71 76  Temp:  98 F (36.7 C) 97.3 F (36.3 C) 97.7 F (36.5 C)  TempSrc:  Oral Oral Oral  Resp:   18 18  Height:      Weight:    98.748 kg (217 lb 11.2 oz)  SpO2: 96% 95% 95% 95%    General: Awake, in nad Cardiovascular: regular, s1, s2 Respiratory: normal resp effort, no wheezing  Discharge Instructions     Medication List    STOP taking these medications        diazepam 5 MG tablet  Commonly known as:  VALIUM     oxyCODONE-acetaminophen 5-325 MG per tablet  Commonly known as:  PERCOCET/ROXICET      TAKE these medications        atomoxetine 100 MG capsule  Commonly known as:  STRATTERA  Take 100 mg by mouth daily.     bimatoprost 0.03 % ophthalmic solution  Commonly known as:  LUMIGAN  Place 1 drop into both eyes at bedtime.     cyclobenzaprine 10 MG tablet  Commonly known as:  FLEXERIL  Take 1 tablet (10 mg total) by mouth 2 (two) times daily as needed for muscle spasms.     dorzolamide-timolol 22.3-6.8 MG/ML ophthalmic solution  Commonly known as:  COSOPT  Place 1 drop into both eyes 2 (two) times daily.  HYDROcodone-acetaminophen 10-325 MG per tablet  Commonly known as:  NORCO  Take 1 tablet by mouth every 8 (eight) hours as needed for moderate pain.     ibuprofen 800 MG tablet  Commonly known as:  ADVIL,MOTRIN  Take 800 mg by mouth every 8 (eight) hours as needed for moderate pain.     pregabalin 225 MG capsule  Commonly known as:  LYRICA  Take 225 mg by mouth 2 (two) times daily.       No Known Allergies Follow-up Information    Follow up with Darrow Bussing, MD. Schedule an appointment as soon as possible for a visit in 1 week.   Specialty:  Family Medicine   Contact information:   [address] [place] [address] [phone]         The results of significant diagnostics from this hospitalization (including imaging, microbiology, ancillary and laboratory) are listed below for reference.    Significant Diagnostic Studies: Dg Chest 2 View  05/16/2014   CLINICAL DATA:  Chest pain and shortness of Breath.  EXAM: CHEST  2 VIEW  COMPARISON:  07/19/2009  FINDINGS: The cardiac silhouette, mediastinal and hilar contours are normal and stable. The lungs are clear. No pleural effusion. The bony thorax is intact. Stable surgical changes involving the left shoulder and cervical spine.  IMPRESSION: No acute cardiopulmonary findings.   Electronically Signed   By: Loralie Champagne M.D.   On: 05/16/2014 20:13   Ct Angio Chest Pe W/cm &/or Wo Cm  05/19/2014   CLINICAL DATA:  Shortness of Breath, worsening.  EXAM: CT ANGIOGRAPHY CHEST WITH CONTRAST  TECHNIQUE: Multidetector CT imaging of the chest was performed using the standard protocol during bolus administration of intravenous contrast. Multiplanar CT image reconstructions and MIPs were obtained to evaluate the vascular anatomy.  CONTRAST:  80mL OMNIPAQUE IOHEXOL 350 MG/ML SOLN  COMPARISON:  None.  FINDINGS: No filling defects in the pulmonary arteries to suggest pulmonary emboli. Heart is normal size. Aorta is normal caliber. No mediastinal, hilar, or axillary adenopathy. Small scattered mediastinal lymph nodes, none pathologically enlarged. Chest wall soft tissues are unremarkable.  Areas of atelectasis in the right lower lobe. Minimal subsegmental atelectasis in the superior segment of the left lower lobe. No pleural effusions.  Imaging into the upper abdomen shows no acute findings. No acute bony abnormality.  Review of the MIP images confirms the above findings.  IMPRESSION: No evidence of pulmonary embolus.  Scattered areas of atelectasis in the lower lobes bilaterally, right greater than left.   Electronically Signed   By: Charlett Nose M.D.   On: 05/19/2014 16:56    Microbiology: No  results found for this or any previous visit (from the past 240 hour(s)).   Labs: Basic Metabolic Panel:  Recent Labs Lab 05/16/14 1845 05/19/14 0540  NA 140 138  K 3.7 3.8  CL 104 105  CO2 25 24  GLUCOSE 112* 102*  BUN 13 14  CREATININE 0.91 0.75  CALCIUM 9.2 8.6   Liver Function Tests:  Recent Labs Lab 05/16/14 1845  AST 33  ALT 32  ALKPHOS 68  BILITOT 0.5  PROT 7.1  ALBUMIN 4.1   No results for input(s): LIPASE, AMYLASE in the last 168 hours. No results for input(s): AMMONIA in the last 168 hours. CBC:  Recent Labs Lab 05/16/14 1845  WBC 6.3  NEUTROABS 3.0  HGB 14.6  HCT 42.8  MCV 83.9  PLT 215   Cardiac Enzymes:  Recent Labs Lab  05/17/14 0244 05/17/14 0544 05/17/14 0847  TROPONINI <0.03 <0.03 <0.03   BNP: BNP (last 3 results)  Recent Labs  05/17/14 1330  BNP 11.4    ProBNP (last 3 results) No results for input(s): PROBNP in the last 8760 hours.  CBG: No results for input(s): GLUCAP in the last 168 hours.  Signed:  CHIU, Scheryl MartenSTEPHEN K  [facility] 05/19/2014, 5:15 PM

## 2014-05-19 NOTE — Progress Notes (Signed)
CT called and notified of IV placement.  Info passed on to RN taking over. Christopher Lee

## 2014-05-19 NOTE — Progress Notes (Signed)
Patient Profile: Christopher Lee is a 59 y.o. male with a history of HLD, obesity, hx of tobacco abuse and no prior cardiac history who presented to [facility] on 05/16/14 with atypical chest pain/left arm pain and SOB for 6 months.  Subjective: Feeling ok. No major complaints. He continues to have dyspnea with exertion. No resting dyspnea. Right groin is ok. No pain. Ambulating w/o difficulty.   Objective: Vital signs in last 24 hours: Temp:  [97.2 F (36.2 C)-98.6 F (37 C)] 97.7 F (36.5 C) (02/09 0546) Pulse Rate:  [71-88] 76 (02/09 0546) Resp:  [10-22] 18 (02/09 0546) BP: (108-140)/(57-77) 111/71 mmHg (02/09 0546) SpO2:  [94 %-97 %] 95 % (02/09 0546) Weight:  [217 lb 11.2 oz (98.748 kg)] 217 lb 11.2 oz (98.748 kg) (02/09 0546) Last BM Date: 05/19/14  Intake/Output from previous day: 02/08 0701 - 02/09 0700 In: [id number] [P.O.:920; I.V.:665] Out: [id number] [Urine:1475] Intake/Output this shift:    Medications Current Facility-Administered Medications  Medication Dose Route Frequency Provider Last Rate Last Dose  . acetaminophen (TYLENOL) tablet 650 mg  650 mg Oral Q4H PRN Lorretta Harp, MD      . albuterol (PROVENTIL) (2.5 MG/3ML) 0.083% nebulizer solution 2.5 mg  2.5 mg Nebulization Q6H PRN Toy Baker, MD      . aspirin chewable tablet 81 mg  81 mg Oral Daily Lorretta Harp, MD      . cyclobenzaprine (FLEXERIL) tablet 10 mg  10 mg Oral BID PRN Toy Baker, MD      . dorzolamide-timolol (COSOPT) 22.3-6.8 MG/ML ophthalmic solution 1 drop  1 drop Both Eyes BID Toy Baker, MD   1 drop at 05/18/14 2115  . HYDROcodone-acetaminophen (NORCO) 10-325 MG per tablet 1 tablet  1 tablet Oral Q8H PRN Toy Baker, MD   1 tablet at 05/18/14 2010  . latanoprost (XALATAN) 0.005 % ophthalmic solution 1 drop  1 drop Both Eyes QHS Toy Baker, MD   1 drop at 05/17/14 2202  . morphine 2 MG/ML injection 2 mg  2 mg Intravenous Q1H PRN Lorretta Harp, MD      .  ondansetron [facility]) injection 4 mg  4 mg Intravenous Q6H PRN Lorretta Harp, MD      . pregabalin (LYRICA) capsule 225 mg  225 mg Oral BID Toy Baker, MD   225 mg at 05/18/14 2115    PE: General appearance: alert, cooperative and no distress Neck: no carotid bruit and no JVD Lungs: clear to auscultation bilaterally Heart: regular rate and rhythm, S1, S2 normal, no murmur, click, rub or gallop Extremities: no LEE Pulses: 2+ and symmetric Skin: warm and dry Neurologic: Grossly normal  Lab Results:   Recent Labs  05/16/14 1845  WBC 6.3  HGB 14.6  HCT 42.8  PLT 215   BMET  Recent Labs  05/16/14 1845 05/19/14 0540  NA 140 138  K 3.7 3.8  CL 104 105  CO2 25 24  GLUCOSE 112* 102*  BUN 13 14  CREATININE 0.91 0.75  CALCIUM 9.2 8.6   PT/INR  Recent Labs  05/18/14 0435  LABPROT 12.7  INR 0.94   Cholesterol  Recent Labs  05/17/14 0244  CHOL 181   Cardiac Panel (last 3 results)  Recent Labs  05/17/14 0244 05/17/14 0544 05/17/14 0847  TROPONINI <0.03 <0.03 <0.03    Studies/Results:  R/LHC 05/18/14  HEMODYNAMICS:   AO SYSTOLIC/AO DIASTOLIC: [id number]  LV SYSTOLIC/LV DIASTOLIC: [id number]  Right atrial pressure: 6/7  Right ventricular  pressure: 27/6  Pulmonary artery pressure: 26/14  Pulmonary artery wedge pressure: 10/8, mean 8  ANGIOGRAPHIC RESULTS:   1. Left main; normal  2. LAD; normal 3. Left circumflex; dominant and normal.  4. Right coronary artery; nondominant and normal 5. Left ventriculography; RAO left ventriculogram was performed using  25 mL of Visipaque dye at 12 mL/second. The overall LVEF estimated  55-60 % Without wall motion abnormalities   Assessment/Plan  Active Problems:   Chest pain   Left arm pain   Dyspnea   Pain in the chest   1. Chest Pain: R/LHC yesterday revealed normal filling pressures, normal coronary arteries and normal LV function. Etiology of CP noncardiac. He has a history of HLD,  obesity, pre-diabetes and tobacco abuse (quit 10 years ago). Work on risk factor reduction/ lifestyle modification for primary prevention.   2. Dyspnea: progressive SOB with exertion.R/LHC unrevealing. Consider pulmonary work up/ PFTs. 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 Former tobacco user (quit 10 years ago).   3. Post Cardiac Cath: Right groin ok. Ambulating w/o difficulty. renal function remains stable with Scr at 0.75.  4. Hyperglycemia: Hgb A1c 5.9. He is at increased risk for developing DM. Stress lifestyle modification through diet and exercise. Recommend yearly monitoring by PCP.  5. HLD: fasting lipid panel 05/17/14 revealed elevated LDL of 114. Given normal coronaries, it is not unreasonable to attempt lifestyle modification as first line therapy. Recommend repeat f/u with PCP ~3-6 months. If no improvement, may consider adding statin for primary prevention, given his other risk factors of obesity, tobacco abuse and hyperglycemia.     LOS: 3 days    Christopher Lee 05/19/2014 9:49 AM  Patient seen and examined and history reviewed. Agree with above findings and plan. Doing well post cath. No groin hematoma. Cath results noted. No cardiac cause for his dyspnea. Further plans per primary team. We will sign off.  Christopher Lee, [place] 05/19/2014 1:18 PM

## 2014-05-20 LAB — SEX HORMONE BINDING GLOBULIN: Sex Hormone Binding: 19 nmol/L — ABNORMAL LOW (ref 22–77)

## 2014-05-20 LAB — TESTOSTERONE, % FREE: Testosterone-% Free: 2.5 % — ABNORMAL HIGH (ref 1.6–2.9)

## 2014-05-20 LAB — TESTOSTERONE, FREE: Testosterone, Free: 35.1 pg/mL — ABNORMAL LOW (ref 47.0–244.0)

## 2014-05-20 LAB — TESTOSTERONE: Testosterone: 142 ng/dL — ABNORMAL LOW (ref 300–890)

## 2014-05-29 ENCOUNTER — Ambulatory Visit (INDEPENDENT_AMBULATORY_CARE_PROVIDER_SITE_OTHER): Payer: BLUE CROSS/BLUE SHIELD | Admitting: Internal Medicine

## 2014-05-29 ENCOUNTER — Other Ambulatory Visit: Payer: BLUE CROSS/BLUE SHIELD

## 2014-05-29 ENCOUNTER — Encounter: Payer: Self-pay | Admitting: Internal Medicine

## 2014-05-29 VITALS — BP 132/90 | HR 70 | Ht 68.0 in | Wt 228.0 lb

## 2014-05-29 DIAGNOSIS — R05 Cough: Secondary | ICD-10-CM

## 2014-05-29 DIAGNOSIS — R059 Cough, unspecified: Secondary | ICD-10-CM

## 2014-05-29 DIAGNOSIS — R0600 Dyspnea, unspecified: Secondary | ICD-10-CM

## 2014-05-29 MED ORDER — FAMOTIDINE 20 MG PO TABS: ORAL_TABLET | ORAL | Status: DC

## 2014-05-29 MED ORDER — PANTOPRAZOLE SODIUM 40 MG PO TBEC: 40.0000 mg | DELAYED_RELEASE_TABLET | Freq: Every day | ORAL | Status: DC

## 2014-05-29 NOTE — Assessment & Plan Note (Signed)
-   Allergy profile 05/29/2014 >>>  - sinus CT 05/29/2014 >>>   The most common causes of chronic cough in immunocompetent adults include the following: upper airway cough syndrome (UACS), previously referred to as postnasal drip syndrome (PNDS), which is caused by variety of rhinosinus conditions; (2) asthma; (3) GERD; (4) chronic bronchitis from cigarette smoking or other inhaled environmental irritants; (5) nonasthmatic eosinophilic bronchitis; and (6) bronchiectasis.   These conditions, singly or in combination, have accounted for up to 94% of the causes of chronic cough in prospective studies.   Other conditions have constituted no >6% of the causes in prospective studies These have included bronchogenic carcinoma, chronic interstitial pneumonia, sarcoidosis, left ventricular failure, ACEI-induced cough, and aspiration from a condition associated with pharyngeal dysfunction.    Chronic cough is often simultaneously caused by more than one condition. A single cause has been found from 38 to 82% of the time, multiple causes from 18 to 62%. Multiply caused cough has been the result of three diseases up to 42% of the time.      Most likely this is  Classic Upper airway cough syndrome, so named because it's frequently impossible to sort out how much is  CR/sinusitis with freq throat clearing (which can be related to primary GERD)   vs  causing  secondary (" extra esophageal")  GERD from wide swings in gastric pressure that occur with throat clearing, often  promoting self use of mint and menthol lozenges that reduce the lower esophageal sphincter tone and exacerbate the problem further in a cyclical fashion.   These are the same pts (now being labeled as having "irritable larynx syndrome" by some cough centers) who not infrequently have a history of having failed to tolerate ace inhibitors,  dry powder inhalers or biphosphonates or report having atypical reflux symptoms that don't respond to standard  doses of PPI , and are easily confused as having aecopd or asthma flares by even experienced allergists/ pulmonologists.   rx for acid first, w/u sinus dz/ allergy and then regroup

## 2014-05-29 NOTE — Patient Instructions (Signed)
Please see patient coordinator before you leave today  to schedule sinus CT   Please remember to go to the lab  department downstairs for your tests - we will call you with the results when they are available.  Pantoprazole (protonix) 40 mg   Take 30-60 min before first meal of the day and Pepcid 20 mg one bedtime until return to office - this is the best way to tell whether stomach acid is contributing to your problem.    GERD (REFLUX)  is an extremely common cause of respiratory symptoms just like yours , many times with no obvious heartburn at all.    It can be treated with medication, but also with lifestyle changes including avoidance of late meals, excessive alcohol, smoking cessation, and avoid fatty foods, chocolate, peppermint, colas, red wine, and acidic juices such as orange juice.  NO MINT OR MENTHOL PRODUCTS SO NO COUGH DROPS  USE SUGARLESS CANDY INSTEAD (Jolley ranchers or Stover's or Life Savers) or even ice chips will also do - the key is to swallow to prevent all throat clearing. NO OIL BASED VITAMINS - use powdered substitutes.  Please schedule a follow up office visit in 4 weeks, sooner if needed

## 2014-05-29 NOTE — Progress Notes (Signed)
   Subjective:    Patient ID: Christopher RichtersStephen R Lee, male    DOB: 1955/12/08,   MRN: [id number]  HPI  [age] yow[age] quit smoking around 2005 at 89 with onset nasal obs x 2013, doe x 2014, new resting sob at end of 2015    05/29/2014 1st [facility] office visit/ Wert   Chief Complaint  Patient presents with  . Pulmonary Consult    Referred by Dr. Boris LownKiorala. Pt c/o SOB for the past year- worse x 2 months. He states he used to run 5 miles, but now can not run at all. Sometimes he gets SOB just with rest.   doe x 100 ft, can't climb ladder s stopping, pattern has been indolent/ progressive doe x 1.5 y but also x 3 m variable Sob at rest will last x 2 breaths just as likely sitting as supine but never wakes him up Episode of cp > ER > nl LHC and pressures 05/18/14   No obvious other patterns in day to day or daytime variabilty or assoc chronic cough or  chest tightness, subjective wheeze overt  hb symptoms. No unusual exp hx or h/o childhood pna/ asthma or knowledge of premature birth.  Sleeping ok without nocturnal  or early am exacerbation  of respiratory  c/o's or need for noct saba. Also denies any obvious fluctuation of symptoms with weather or environmental changes or other aggravating or alleviating factors except as outlined above   Current Medications, Allergies, Complete Past Medical History, Past Surgical History, Family History, and Social History were reviewed in [facility] electronic medical record.           Review of Systems  Constitutional: Negative for fever, chills, activity change, appetite change and unexpected weight change.  HENT: Positive for congestion. Negative for dental problem, postnasal drip, rhinorrhea, sneezing, sore throat, trouble swallowing and voice change.   Eyes: Negative for visual disturbance.  Respiratory: Positive for shortness of breath. Negative for cough and choking.   Cardiovascular: Negative for chest pain and leg swelling.  Gastrointestinal:  Negative for nausea, vomiting and abdominal pain.  Genitourinary: Negative for difficulty urinating.  Musculoskeletal: Positive for arthralgias.  Skin: Negative for rash.  Psychiatric/Behavioral: Negative for behavioral problems and confusion.       Objective:   Physical Exam  amb wm with nasal tone to voice  Wt Readings from Last 3 Encounters:  05/29/14 228 lb (103.42 kg)  05/19/14 217 lb 11.2 oz (98.748 kg)  03/10/13 220 lb (99.791 kg)    Vital signs reviewed  HEENT: nl dentition, turbinates, and orophanx. Nl external ear canals without cough reflex   NECK :  without JVD/Nodes/TM/ nl carotid upstrokes bilaterally   LUNGS: no acc muscle use, clear to A and P bilaterally without cough on insp or exp maneuvers   CV:  RRR  no s3 or murmur or increase in P2, no edema   ABD:  soft and nontender with nl excursion in the supine position. No bruits or organomegaly, bowel sounds nl  MS:  warm without deformities, calf tenderness, cyanosis or clubbing  SKIN: warm and dry without lesions    NEURO:  alert, approp, no deficits    CTa 05/19/14  Images reviewed No evidence of pulmonary embolus. Scattered areas of atelectasis in the lower lobes bilaterally, right greater than left.           Assessment & Plan:

## 2014-05-29 NOTE — Assessment & Plan Note (Signed)
-   05/29/2014  Walked RA x 3 laps @ 185 ft each stopped due to  End of study, min sob, fast pace, no desat - spirometry 05/29/2014 wnl x for fef 25-75= 65%   Symptoms are markedly disproportionate to objective findings and not clear this is a lung problem but pt does appear to have difficult airway management issues. DDX of  difficult airways management all start with A and  include Adherence, Ace Inhibitors, Acid Reflux, Active Sinus Disease, Alpha 1 Antitripsin deficiency, Anxiety masquerading as Airways dz,  ABPA,  allergy(esp in young), Aspiration (esp in elderly), Adverse effects of DPI,  Active smokers, plus two Bs  = Bronchiectasis and Beta blocker use..and one C= CHF    ? Acid (or non-acid) GERD > always difficult to exclude as up to 75% of pts in some series report no assoc GI/ Heartburn symptoms> rec max (24h)  acid suppression and diet restrictions/ reviewed and instructions given in writing.     active sinus dz with sino bronchial reflex> sinus ct  ? Allergy > no eos of diff > check allergy profile   ? Anxiety > clearly causing his sob at rest x 2 breaths, already on psychotropics     Each maintenance medication was reviewed in detail including most importantly the difference between maintenance and as needed and under what circumstances the prns are to be used.  Please see instructions for details which were reviewed in writing and the patient given a copy.

## 2014-06-01 LAB — ALLERGY FULL PROFILE
Allergen, D pternoyssinus,d7: <0.1 kU/L
Allergen,Goose feathers, e70: <0.1 kU/L
Alternaria Alternata: <0.1 kU/L
Aspergillus fumigatus, m3: <0.1 kU/L
Bahia Grass: <0.1 kU/L
Bermuda Grass: <0.1 kU/L
Box Elder IgE: <0.1 kU/L
Candida Albicans: <0.1 kU/L
Cat Dander: <0.1 kU/L
Common Ragweed: <0.1 kU/L
Curvularia lunata: <0.1 kU/L
D. farinae: <0.1 kU/L
Dog Dander: <0.1 kU/L
Elm IgE: <0.1 kU/L
Fescue: <0.1 kU/L
G005 Rye, Perennial: <0.1 kU/L
G009 Red Top: <0.1 kU/L
Goldenrod: <0.1 kU/L
Helminthosporium halodes: <0.1 kU/L
House Dust Hollister: <0.1 kU/L
IgE (Immunoglobulin E), Serum: 4 kU/L (ref <115)
Lamb's Quarters: <0.1 kU/L
Oak: <0.1 kU/L
Plantain: <0.1 kU/L
Stemphylium Botryosum: <0.1 kU/L
Sycamore Tree: <0.1 kU/L
Timothy Grass: <0.1 kU/L

## 2014-06-01 NOTE — Progress Notes (Signed)
Quick Note:  LMTCB ______ 

## 2014-06-02 ENCOUNTER — Telehealth: Payer: Self-pay | Admitting: Internal Medicine

## 2014-06-02 NOTE — Telephone Encounter (Signed)
Pt is aware of normal spirometry results.

## 2014-06-02 NOTE — Progress Notes (Signed)
Quick Note:  Pt notified of results ______ 

## 2014-06-03 ENCOUNTER — Ambulatory Visit (INDEPENDENT_AMBULATORY_CARE_PROVIDER_SITE_OTHER)
Admission: RE | Admit: 2014-06-03 | Discharge: 2014-06-03 | Disposition: A | Discharge status: Home / Self Care | Payer: BLUE CROSS/BLUE SHIELD | Source: Ambulatory Visit | Attending: Internal Medicine | Admitting: Internal Medicine

## 2014-06-03 DIAGNOSIS — R059 Cough, unspecified: Secondary | ICD-10-CM

## 2014-06-03 DIAGNOSIS — R05 Cough: Secondary | ICD-10-CM

## 2014-06-04 ENCOUNTER — Other Ambulatory Visit: Payer: Self-pay | Admitting: Internal Medicine

## 2014-06-04 DIAGNOSIS — R05 Cough: Secondary | ICD-10-CM

## 2014-06-04 DIAGNOSIS — R059 Cough, unspecified: Secondary | ICD-10-CM

## 2014-06-04 NOTE — Progress Notes (Signed)
Quick Note:  Spoke with pt and notified of results per Dr. Wert. Pt verbalized understanding and denied any questions.  ______ 

## 2014-06-18 ENCOUNTER — Other Ambulatory Visit: Payer: Self-pay | Admitting: Family Medicine

## 2014-06-18 ENCOUNTER — Ambulatory Visit
Admission: RE | Admit: 2014-06-18 | Discharge: 2014-06-18 | Disposition: A | Discharge status: Home / Self Care | Payer: BLUE CROSS/BLUE SHIELD | Source: Ambulatory Visit | Attending: Family Medicine | Admitting: Family Medicine

## 2014-06-18 DIAGNOSIS — M25511 Pain in right shoulder: Secondary | ICD-10-CM

## 2014-06-26 ENCOUNTER — Encounter: Payer: Self-pay | Admitting: Internal Medicine

## 2014-06-26 ENCOUNTER — Ambulatory Visit (INDEPENDENT_AMBULATORY_CARE_PROVIDER_SITE_OTHER): Payer: BLUE CROSS/BLUE SHIELD | Admitting: Internal Medicine

## 2014-06-26 VITALS — BP 126/78 | HR 82 | Ht 68.0 in | Wt 230.0 lb

## 2014-06-26 DIAGNOSIS — R059 Cough, unspecified: Secondary | ICD-10-CM

## 2014-06-26 DIAGNOSIS — J4489 Other specified chronic obstructive pulmonary disease: Secondary | ICD-10-CM | POA: Insufficient documentation

## 2014-06-26 DIAGNOSIS — J449 Chronic obstructive pulmonary disease, unspecified: Secondary | ICD-10-CM

## 2014-06-26 DIAGNOSIS — R05 Cough: Secondary | ICD-10-CM

## 2014-06-26 DIAGNOSIS — R0600 Dyspnea, unspecified: Secondary | ICD-10-CM

## 2014-06-26 HISTORY — DX: Chronic obstructive pulmonary disease, unspecified: J44.9

## 2014-06-26 HISTORY — DX: Other specified chronic obstructive pulmonary disease: J44.89

## 2014-06-26 MED ORDER — MOMETASONE FURO-FORMOTEROL FUM 100-5 MCG/ACT IN AERO: INHALATION_SPRAY | RESPIRATORY_TRACT | Status: AC

## 2014-06-26 NOTE — Progress Notes (Signed)
Subjective:   Patient ID: Christopher Lee, male    DOB: March 02, 1956   MRN: [id number]    Brief patient profile:  58 yowm quit smoking around 2005 wt  195 with onset nasal obst x 2013, doe x 2014, new resting sob at end of 2015 referred too pulmonary clinic 05/29/14 by Tylene Fantasia and established dx of sinusitis/ AB (see ov 06/26/14 )    History of Present Illness  05/29/2014 1st [facility] office visit/ Christopher Lee   Chief Complaint  Patient presents with  . Pulmonary Consult    Referred by Dr. Boris Lown. Pt c/o SOB for the past year- worse x 2 months. He states he used to run 5 miles, but now can not run at all. Sometimes he gets SOB just with rest.   doe x 100 ft, can't climb ladder s stopping, pattern has been indolent/ progressive doe x 1.5 y but also x 3 months  variable Sob at rest will last x 2 breaths just as likely sitting as supine but never wakes him up Episode of cp > ER > nl LHC and pressures 05/18/14 rec Please see patient coordinator before you leave today  to schedule sinus CT > pos >shoemaker eval  Pantoprazole (protonix) 40 mg   Take 30-60 min before first meal of the day and Pepcid 20 mg one bedtime until return to office - this is the best way to tell whether stomach acid is contributing to your problem.   GERD diet      06/26/2014 f/u ov/Christopher Lee re: doe/ cough attributed to sinusitis/ f/u by [facility]  Chief Complaint  Patient presents with  . Follow-up    Pt states his breathing is unchanged since his last visit. No new co's today.   has never used inhalers before, doe is better but still sob x 100 ft, sinus symptoms no better and considering sinus surgery   Cough is congested/ slt discolored mucus esp in am's  No obvious day to day or daytime variabilty or assoc cp or chest tightness, subjective wheeze overt sinus or hb symptoms. No unusual exp hx or h/o childhood pna/ asthma or knowledge of premature birth.  Sleeping ok without nocturnal  or early am exacerbation   of respiratory  c/o's or need for noct saba. Also denies any obvious fluctuation of symptoms with weather or environmental changes or other aggravating or alleviating factors except as outlined above   Current Medications, Allergies, Complete Past Medical History, Past Surgical History, Family History, and Social History were reviewed in [employer] record.  ROS  The following are not active complaints unless bolded sore throat, dysphagia, dental problems, itching, sneezing,  nasal congestion or excess/ purulent secretions, ear ache,   fever, chills, sweats, unintended wt loss, pleuritic or exertional cp, hemoptysis,  orthopnea pnd or leg swelling, presyncope, palpitations, heartburn, abdominal pain, anorexia, nausea, vomiting, diarrhea  or change in bowel or urinary habits, change in stools or urine, dysuria,hematuria,  rash, arthralgias, visual complaints, headache, numbness weakness or ataxia or problems with walking or coordination,  change in mood/affect or memory.                    Objective:   Physical Exam  amb wm with nasal tone to voice  06/26/2014       230  Wt Readings from Last 3 Encounters:  05/29/14 228 lb (103.42 kg)  05/19/14 217 lb 11.2 oz (98.748 kg)  03/10/13 220 lb (99.791 kg)  Vital signs reviewed  HEENT: nl dentition, boggy bilateral turbinates, and orophanx min pos creamy pnd  Nl external ear canals without cough reflex   NECK :  without JVD/Nodes/TM/ nl carotid upstrokes bilaterally   LUNGS: no acc muscle use, clear to A and P bilaterally without cough on insp or exp maneuvers   CV:  RRR  no s3 or murmur or increase in P2, no edema   ABD:  soft and nontender with nl excursion in the supine position. No bruits or organomegaly, bowel sounds nl  MS:  warm without deformities, calf tenderness, cyanosis or clubbing  SKIN: warm and dry without lesions    NEURO:  alert, approp, no deficits    CTa 05/19/14  Images reviewed No  evidence of pulmonary embolus. Scattered areas of atelectasis in the lower lobes bilaterally, right greater than left.           Assessment & Plan:

## 2014-06-26 NOTE — Patient Instructions (Addendum)
Keep appt to see Christopher Lee  dulera 100 Take 2 puffs first thing in am and then another 2 puffs about 12 hours later.   Stop the acid suppressors   Please schedule a follow up office visit in 6 weeks, call sooner if needed

## 2014-06-27 ENCOUNTER — Encounter: Payer: Self-pay | Admitting: Internal Medicine

## 2014-06-27 NOTE — Assessment & Plan Note (Signed)
-   Allergy profile 05/29/2014 > IgE 4 , neg RAST - sinus CT  06/03/14 >  Severe sinus dz > Shoemaker   Encouraged to keep f/u appts/ rx per ENT

## 2014-06-27 NOTE — Assessment & Plan Note (Addendum)
-   Spirometry 06/26/14 FEV1  2.45 69%) ratio 65 vs nl spirometry 05/29/14 - 06/26/2014 p extensive coaching HFA effectiveness =    75% so try dulera 100 2bid    Clearly has an asthmatic component ? If will eliminate it with correction of sinobronchial reflex > time will tell in the meantime rec maint rx with dulera 100 2bid   May need to address the timolol eyedrops if symptoms no better on dulera as there can be significant systemic effects from this non-specific BB

## 2014-06-27 NOTE — Assessment & Plan Note (Signed)
-   05/29/2014  Walked RA x 3 laps @ 185 ft each stopped due to  End of study, min sob, fast pace, no desat - spirometry 05/29/2014 wnl x for fef 25-75= 65% >see ov 06/26/14 with def airflow obst - 06/26/2014  Walked RA x 3 laps @ 185 ft each stopped due to  Mild sob/ nl pace no desat   Suspect this is mostly due to AB though the hx is a bit unusual so rx AB/ sinusitis first then regroup     Each maintenance medication was reviewed in detail including most importantly the difference between maintenance and as needed and under what circumstances the prns are to be used.  Please see instructions for details which were reviewed in writing and the patient given a copy.

## 2014-07-23 ENCOUNTER — Other Ambulatory Visit: Payer: Self-pay | Admitting: Otolaryngology

## 2014-07-24 NOTE — Pre-Procedure Instructions (Addendum)
Esperanza RichtersStephen R Osborn  07/24/2014   Your procedure is scheduled on:  Friday, April 22nd   [facility] at 5:30 AM.  Call this number if you have problems the morning of surgery: [phone]   Remember:   Do not eat food or drink liquids after midnight Thursday.   Take these medicines the morning of surgery with A SIP OF WATER: Pepcid complete, Tramadol.  Please use the Dulera, Timoptic, strattera.   Do not wear jewelry - no rings or watches.  Do not wear lotions or colognes. You may NOT wear deodorant the day of surgery.   Men may shave face and neck.  Do not bring valuables to the hospital.  [facility] is not responsible for any belongings or valuables.               Contacts, dentures or bridgework may not be worn into surgery.  Leave suitcase in the car. After surgery it may be brought to your room.  For patients admitted to the hospital, discharge time is determined by your treatment team.               Patients discharged the day of surgery will not be allowed to drive home.   Name and phone number of your driver:    Special Instructions: "Preparing for Surgery" instruction sheet.   Please read over the following fact sheets that you were given: Pain Booklet, Coughing and Deep Breathing and Surgical Site Infection Prevention

## 2014-07-27 ENCOUNTER — Encounter (HOSPITAL_COMMUNITY)
Admission: RE | Admit: 2014-07-27 | Discharge: 2014-07-27 | Disposition: A | Discharge status: Home / Self Care | Payer: BLUE CROSS/BLUE SHIELD | Source: Ambulatory Visit | Attending: Otolaryngology | Admitting: Otolaryngology

## 2014-07-27 ENCOUNTER — Encounter (HOSPITAL_COMMUNITY): Payer: Self-pay

## 2014-07-27 DIAGNOSIS — G2581 Restless legs syndrome: Secondary | ICD-10-CM | POA: Insufficient documentation

## 2014-07-27 DIAGNOSIS — Z01812 Encounter for preprocedural laboratory examination: Secondary | ICD-10-CM | POA: Insufficient documentation

## 2014-07-27 DIAGNOSIS — Z01818 Encounter for other preprocedural examination: Secondary | ICD-10-CM | POA: Insufficient documentation

## 2014-07-27 DIAGNOSIS — E785 Hyperlipidemia, unspecified: Secondary | ICD-10-CM | POA: Diagnosis not present

## 2014-07-27 DIAGNOSIS — J329 Chronic sinusitis, unspecified: Secondary | ICD-10-CM | POA: Diagnosis not present

## 2014-07-27 DIAGNOSIS — Z87891 Personal history of nicotine dependence: Secondary | ICD-10-CM | POA: Insufficient documentation

## 2014-07-27 LAB — CBC
HCT: 42.2 % (ref 39.0–52.0)
Hemoglobin: 13.4 g/dL (ref 13.0–17.0)
MCH: 27.6 pg (ref 26.0–34.0)
MCHC: 31.8 g/dL (ref 30.0–36.0)
MCV: 86.8 fL (ref 78.0–100.0)
Platelets: 293 10*3/uL (ref 150–400)
RBC: 4.86 MIL/uL (ref 4.22–5.81)
RDW: 13.4 % (ref 11.5–15.5)
WBC: 10.3 10*3/uL (ref 4.0–10.5)

## 2014-07-27 LAB — BASIC METABOLIC PANEL
Anion gap: 14 (ref 5–15)
BUN: 18 mg/dL (ref 6–23)
CO2: 23 mmol/L (ref 19–32)
Calcium: 9 mg/dL (ref 8.4–10.5)
Chloride: 103 mmol/L (ref 96–112)
Creatinine, Ser: 1.11 mg/dL (ref 0.50–1.35)
GFR calc Af Amer: 83 mL/min — ABNORMAL LOW (ref >90)
GFR calc non Af Amer: 71 mL/min — ABNORMAL LOW (ref >90)
Glucose, Bld: 107 mg/dL — ABNORMAL HIGH (ref 70–99)
Potassium: 3.8 mmol/L (ref 3.5–5.1)
Sodium: 140 mmol/L (ref 135–145)

## 2014-07-28 NOTE — Progress Notes (Signed)
Anesthesia Chart Review: Patient is a 59 year old male scheduled for nasal septoplasty and bilateral inferior turbinate reduction with endoscopic sinus surgery and fusion scan on 07/31/14 by Dr. Annalee GentaShoemaker.   History includes former smoker, RLS, glaucoma, HLD, asthmatic bronchitis, back and neck surgery, normal coronaries '16 (Dr. Allyson SabalBerry). BMI is consistent with obesity. PCP is Dr. Boris LownKiorala. Pulmonologist is Dr. Sherene SiresWert.   Meds include Strattera, Lumigan, Clomid, Flexeril, Pepcid Complete, Dulera, Lyrica, Timoptic, tramadol.  05/18/14 Cardiac cath: HEMODYNAMICS:  AO SYSTOLIC/AO DIASTOLIC: 118/69 LV SYSTOLIC/LV DIASTOLIC: 124/0 Right atrial pressure: 6/7 Right ventricular pressure: 27/6 Pulmonary artery pressure: 26/14 Pulmonary artery wedge pressure: 10/8, mean 8 ANGIOGRAPHIC RESULTS:  1. Left main; normal  2. LAD; normal 3. Left circumflex; dominant and normal.  4. Right coronary artery; nondominant and normal 5. Left ventriculography:The overall LVEF estimated 55-60% without wall motion abnormalities IMPRESSION:Christopher Lee has normal coronary arteries, normal LV function and normal filling pressures.I do not think his chest pain shortness of breath was coming from his heart. Other etiologies will need to be considered (Dr. Nanetta BattyJonathan Berry).  05/17/14 Echo: - Left ventricle: The cavity size was normal. Systolic function was normal. The estimated ejection fraction was in the range of 55% to 60%. Regional wall motion abnormalities cannot be excluded. Doppler parameters are consistent with abnormal left ventricular relaxation (grade 1 diastolic dysfunction). There was no evidence of elevated ventricular filling pressure by Doppler parameters. - Aortic valve: There was trivial regurgitation. - Aortic root: The aortic root was normal in size. - Mitral valve: Structurally normal valve. There was no regurgitation. - Right ventricle: The cavity size was normal. Wall thickness was normal. Systolic  function was normal. - Right atrium: The atrium was normal in size. - Tricuspid valve: There was mild regurgitation. - Pulmonary arteries: The main pulmonary artery was normal-sized. Systolic pressure was within the normal range. - Inferior vena cava: The vessel was normal in size. - Pericardium, extracardiac: There was no pericardial effusion.  05/17/14 EKG: SR with first degree AVB.  According to Dr. Thurston HoleWert's notes: - 05/29/2014 Walked RA x 3 laps @ 185 ft each stopped due to End of study, min sob, fast pace, no desat - spirometry 05/29/2014 wnl x for fef 25-75= 65% >see ov 06/26/14 with def airflow obst - 06/26/2014 Walked RA x 3 laps @ 185 ft each stopped due to Mild sob/ nl pace no desat  Suspect this is mostly due to AB though the hx is a bit unusual so rx AB/ sinusitis first then regroup   05/16/14 CXR: No acute cardiopulmonary findings.  05/16/14 Chest CTA: IMPRESSION: No evidence of pulmonary embolus. Scattered areas of atelectasis in the lower lobes bilaterally, right greater than left.  Preoperative labs noted.   Anticipate that he can proceed as planned.  Velna Ochsllison Britton Perkinson, PA-C [facility]hesiology Phone [phone] 07/28/2014 12:06 PM

## 2014-07-30 MED ORDER — DEXAMETHASONE SODIUM PHOSPHATE 10 MG/ML IJ SOLN
10.0000 mg | Freq: Once | INTRAMUSCULAR | Status: AC
Start: 2014-07-30 — End: 2014-07-31
  Administered 2014-07-31: 10 mg via INTRAVENOUS
  Filled 2014-07-30: qty 1

## 2014-07-30 MED ORDER — CEFAZOLIN SODIUM-DEXTROSE 2-3 GM-% IV SOLR
2.0000 g | INTRAVENOUS | Status: AC
  Administered 2014-07-31: 2 g via INTRAVENOUS
  Filled 2014-07-30: qty 50

## 2014-07-31 ENCOUNTER — Encounter (HOSPITAL_COMMUNITY): Payer: Self-pay | Admitting: *Deleted

## 2014-07-31 ENCOUNTER — Ambulatory Visit (HOSPITAL_COMMUNITY): Payer: BLUE CROSS/BLUE SHIELD | Admitting: Anesthesiology

## 2014-07-31 ENCOUNTER — Ambulatory Visit (HOSPITAL_COMMUNITY): Payer: BLUE CROSS/BLUE SHIELD | Admitting: Vascular Surgery

## 2014-07-31 ENCOUNTER — Encounter (HOSPITAL_COMMUNITY)
Admission: RE | Disposition: A | Discharge status: Home / Self Care | Payer: Self-pay | Source: Ambulatory Visit | Attending: Otolaryngology

## 2014-07-31 ENCOUNTER — Ambulatory Visit (HOSPITAL_COMMUNITY)
Admission: RE | Admit: 2014-07-31 | Discharge: 2014-07-31 | Disposition: A | Discharge status: Home / Self Care | Payer: BLUE CROSS/BLUE SHIELD | Source: Ambulatory Visit | Attending: Otolaryngology | Admitting: Otolaryngology

## 2014-07-31 DIAGNOSIS — E785 Hyperlipidemia, unspecified: Secondary | ICD-10-CM | POA: Insufficient documentation

## 2014-07-31 DIAGNOSIS — J45909 Unspecified asthma, uncomplicated: Secondary | ICD-10-CM | POA: Diagnosis not present

## 2014-07-31 DIAGNOSIS — J339 Nasal polyp, unspecified: Secondary | ICD-10-CM | POA: Insufficient documentation

## 2014-07-31 DIAGNOSIS — H409 Unspecified glaucoma: Secondary | ICD-10-CM | POA: Insufficient documentation

## 2014-07-31 DIAGNOSIS — J338 Other polyp of sinus: Secondary | ICD-10-CM | POA: Diagnosis present

## 2014-07-31 DIAGNOSIS — Z87891 Personal history of nicotine dependence: Secondary | ICD-10-CM | POA: Insufficient documentation

## 2014-07-31 DIAGNOSIS — J329 Chronic sinusitis, unspecified: Secondary | ICD-10-CM | POA: Insufficient documentation

## 2014-07-31 DIAGNOSIS — J342 Deviated nasal septum: Secondary | ICD-10-CM

## 2014-07-31 HISTORY — PX: SINUS ENDO WITH FUSION: SHX5329

## 2014-07-31 HISTORY — PX: NASAL SEPTOPLASTY W/ TURBINOPLASTY: SHX2070

## 2014-07-31 SURGERY — SEPTOPLASTY, NOSE, WITH NASAL TURBINATE REDUCTION
Anesthesia: General | Site: Nose | Laterality: Bilateral

## 2014-07-31 MED ORDER — PROPOFOL 10 MG/ML IV BOLUS
INTRAVENOUS | Status: DC | PRN
  Administered 2014-07-31: 200 mg via INTRAVENOUS

## 2014-07-31 MED ORDER — ONDANSETRON HCL 4 MG/2ML IJ SOLN: 4.0000 mg | Freq: Once | INTRAMUSCULAR | Status: DC | PRN

## 2014-07-31 MED ORDER — LIDOCAINE HCL (CARDIAC) 20 MG/ML IV SOLN
INTRAVENOUS | Status: DC | PRN
  Administered 2014-07-31: 75 mg via INTRAVENOUS

## 2014-07-31 MED ORDER — HYDROCODONE-ACETAMINOPHEN 5-325 MG PO TABS: 1.0000 | ORAL_TABLET | Freq: Four times a day (QID) | ORAL | Status: DC | PRN

## 2014-07-31 MED ORDER — HYDRALAZINE HCL 20 MG/ML IJ SOLN
INTRAMUSCULAR | Status: AC
  Filled 2014-07-31: qty 1

## 2014-07-31 MED ORDER — TRIAMCINOLONE ACETONIDE 40 MG/ML IJ SUSP
INTRAMUSCULAR | Status: DC | PRN
  Administered 2014-07-31: 40 mg

## 2014-07-31 MED ORDER — LIDOCAINE-EPINEPHRINE 1 %-1:100000 IJ SOLN
INTRAMUSCULAR | Status: DC | PRN
  Administered 2014-07-31: 30 mL

## 2014-07-31 MED ORDER — SUCCINYLCHOLINE CHLORIDE 20 MG/ML IJ SOLN
INTRAMUSCULAR | Status: AC
  Filled 2014-07-31: qty 1

## 2014-07-31 MED ORDER — PROPOFOL 10 MG/ML IV BOLUS
INTRAVENOUS | Status: AC
  Filled 2014-07-31: qty 20

## 2014-07-31 MED ORDER — HYDROCODONE-ACETAMINOPHEN 5-325 MG PO TABS
2.0000 | ORAL_TABLET | Freq: Once | ORAL | Status: AC
  Administered 2014-07-31: 2 via ORAL

## 2014-07-31 MED ORDER — GLYCOPYRROLATE 0.2 MG/ML IJ SOLN
INTRAMUSCULAR | Status: AC
  Filled 2014-07-31: qty 1

## 2014-07-31 MED ORDER — OXYMETAZOLINE HCL 0.05 % NA SOLN
NASAL | Status: DC | PRN
  Administered 2014-07-31 (×2): 1 via NASAL

## 2014-07-31 MED ORDER — LIDOCAINE-EPINEPHRINE 1 %-1:100000 IJ SOLN
INTRAMUSCULAR | Status: AC
  Filled 2014-07-31: qty 1

## 2014-07-31 MED ORDER — MIDAZOLAM HCL 2 MG/2ML IJ SOLN
INTRAMUSCULAR | Status: AC
  Filled 2014-07-31: qty 2

## 2014-07-31 MED ORDER — LACTATED RINGERS IV SOLN
INTRAVENOUS | Status: DC | PRN
  Administered 2014-07-31: 07:00:00 via INTRAVENOUS

## 2014-07-31 MED ORDER — ONDANSETRON HCL 4 MG/2ML IJ SOLN
INTRAMUSCULAR | Status: AC
  Filled 2014-07-31: qty 2

## 2014-07-31 MED ORDER — ROCURONIUM BROMIDE 100 MG/10ML IV SOLN
INTRAVENOUS | Status: DC | PRN
  Administered 2014-07-31: 50 mg via INTRAVENOUS

## 2014-07-31 MED ORDER — TRIAMCINOLONE ACETONIDE 40 MG/ML IJ SUSP
INTRAMUSCULAR | Status: AC
  Filled 2014-07-31: qty 5

## 2014-07-31 MED ORDER — OXYMETAZOLINE HCL 0.05 % NA SOLN
NASAL | Status: DC | PRN
  Administered 2014-07-31: 1 via NASAL
  Administered 2014-07-31: 2 via NASAL

## 2014-07-31 MED ORDER — MUPIROCIN CALCIUM 2 % EX CREA
TOPICAL_CREAM | CUTANEOUS | Status: DC | PRN
  Administered 2014-07-31 (×2): 1 via TOPICAL

## 2014-07-31 MED ORDER — OXYCODONE HCL 5 MG/5ML PO SOLN: 5.0000 mg | Freq: Once | ORAL | Status: DC | PRN

## 2014-07-31 MED ORDER — HYDROMORPHONE HCL 1 MG/ML IJ SOLN
INTRAMUSCULAR | Status: AC
  Filled 2014-07-31: qty 1

## 2014-07-31 MED ORDER — ROCURONIUM BROMIDE 50 MG/5ML IV SOLN
INTRAVENOUS | Status: AC
  Filled 2014-07-31: qty 1

## 2014-07-31 MED ORDER — OXYCODONE HCL 5 MG PO TABS
5.0000 mg | ORAL_TABLET | Freq: Once | ORAL | Status: DC | PRN
  Administered 2014-07-31: 5 mg via ORAL

## 2014-07-31 MED ORDER — FENTANYL CITRATE (PF) 100 MCG/2ML IJ SOLN
INTRAMUSCULAR | Status: DC | PRN
  Administered 2014-07-31: 100 ug via INTRAVENOUS
  Administered 2014-07-31: 50 ug via INTRAVENOUS

## 2014-07-31 MED ORDER — ONDANSETRON HCL 4 MG/2ML IJ SOLN
INTRAMUSCULAR | Status: DC | PRN
  Administered 2014-07-31: 4 mg via INTRAVENOUS

## 2014-07-31 MED ORDER — AMOXICILLIN-POT CLAVULANATE 500-125 MG PO TABS: 1.0000 | ORAL_TABLET | Freq: Two times a day (BID) | ORAL | Status: DC

## 2014-07-31 MED ORDER — LIDOCAINE HCL (CARDIAC) 20 MG/ML IV SOLN
INTRAVENOUS | Status: AC
  Filled 2014-07-31: qty 5

## 2014-07-31 MED ORDER — MUPIROCIN CALCIUM 2 % EX CREA
TOPICAL_CREAM | CUTANEOUS | Status: AC
  Filled 2014-07-31: qty 15

## 2014-07-31 MED ORDER — LIDOCAINE-EPINEPHRINE (PF) 1 %-1:200000 IJ SOLN
INTRAMUSCULAR | Status: AC
  Filled 2014-07-31: qty 10

## 2014-07-31 MED ORDER — 0.9 % SODIUM CHLORIDE (POUR BTL) OPTIME
TOPICAL | Status: DC | PRN
  Administered 2014-07-31: 1000 mL

## 2014-07-31 MED ORDER — HYDROMORPHONE HCL 1 MG/ML IJ SOLN
0.2500 mg | INTRAMUSCULAR | Status: DC | PRN
  Administered 2014-07-31 (×2): 0.5 mg via INTRAVENOUS

## 2014-07-31 MED ORDER — OXYMETAZOLINE HCL 0.05 % NA SOLN
NASAL | Status: AC
  Filled 2014-07-31: qty 45

## 2014-07-31 MED ORDER — HYDROCODONE-ACETAMINOPHEN 5-325 MG PO TABS
ORAL_TABLET | ORAL | Status: AC
  Filled 2014-07-31: qty 2

## 2014-07-31 MED ORDER — MIDAZOLAM HCL 5 MG/5ML IJ SOLN
INTRAMUSCULAR | Status: DC | PRN
  Administered 2014-07-31: 2 mg via INTRAVENOUS

## 2014-07-31 MED ORDER — OXYCODONE HCL 5 MG PO TABS
ORAL_TABLET | ORAL | Status: AC
  Filled 2014-07-31: qty 1

## 2014-07-31 MED ORDER — ARTIFICIAL TEARS OP OINT
TOPICAL_OINTMENT | OPHTHALMIC | Status: AC
  Filled 2014-07-31: qty 3.5

## 2014-07-31 MED ORDER — SODIUM CHLORIDE 0.9 % IR SOLN
Status: DC | PRN
  Administered 2014-07-31: 1000 mL

## 2014-07-31 MED ORDER — HYDRALAZINE HCL 20 MG/ML IJ SOLN
INTRAMUSCULAR | Status: DC | PRN
  Administered 2014-07-31: 5 mg via INTRAVENOUS

## 2014-07-31 MED ORDER — FENTANYL CITRATE (PF) 250 MCG/5ML IJ SOLN
INTRAMUSCULAR | Status: AC
  Filled 2014-07-31: qty 5

## 2014-07-31 SURGICAL SUPPLY — 42 items
BLADE ROTATE RAD 40 4 M4 (BLADE) ×1 IMPLANT
BLADE ROTATE TRICUT 4X13 M4 (BLADE) ×1 IMPLANT
CANISTER SUCTION 2500CC (MISCELLANEOUS) ×4 IMPLANT
DRAPE PROXIMA HALF (DRAPES) ×1 IMPLANT
ELECT COATED BLADE 2.86 ST (ELECTRODE) IMPLANT
FILTER ARTHROSCOPY CONVERTOR (FILTER) ×2 IMPLANT
FLUID NSS /IRRIG 1000 ML XXX (MISCELLANEOUS) ×2 IMPLANT
GAUZE SPONGE 2X2 8PLY STRL LF (GAUZE/BANDAGES/DRESSINGS) ×1 IMPLANT
GLOVE BIOGEL M 7.0 STRL (GLOVE) ×4 IMPLANT
GLOVE SURG SS PI 7.0 STRL IVOR (GLOVE) ×1 IMPLANT
GOWN STRL REUS W/ TWL LRG LVL3 (GOWN DISPOSABLE) ×2 IMPLANT
GOWN STRL REUS W/TWL LRG LVL3 (GOWN DISPOSABLE) ×4
KIT BASIN OR (CUSTOM PROCEDURE TRAY) ×2 IMPLANT
KIT ROOM TURNOVER OR (KITS) ×2 IMPLANT
MEROCEL KEHHEDY SINUS-PAK ×1 IMPLANT
NDL 18GX1X1/2 (RX/OR ONLY) (NEEDLE) IMPLANT
NDL HYPO 25GX1X1/2 BEV (NEEDLE) IMPLANT
NEEDLE 18GX1X1/2 (RX/OR ONLY) (NEEDLE) ×2 IMPLANT
NEEDLE HYPO 25GX1X1/2 BEV (NEEDLE) ×2 IMPLANT
NS IRRIG 1000ML POUR BTL (IV SOLUTION) ×2 IMPLANT
PAD ARMBOARD 7.5X6 YLW CONV (MISCELLANEOUS) ×4 IMPLANT
PENCIL BUTTON HOLSTER BLD 10FT (ELECTRODE) IMPLANT
SPECIMEN JAR SMALL (MISCELLANEOUS) ×2 IMPLANT
SPLINT NASAL DOYLE BI-VL (GAUZE/BANDAGES/DRESSINGS) ×2 IMPLANT
SPONGE GAUZE 2X2 STER 10/PKG (GAUZE/BANDAGES/DRESSINGS) ×1
SPONGE NEURO XRAY DETECT 1X3 (DISPOSABLE) ×2 IMPLANT
SUT ETHILON 3 0 FSL (SUTURE) IMPLANT
SUT ETHILON 3 0 PS 1 (SUTURE) ×2 IMPLANT
SUT PLAIN 4 0 ~~LOC~~ 1 (SUTURE) ×2 IMPLANT
SWAB COLLECTION DEVICE MRSA (MISCELLANEOUS) ×1 IMPLANT
SYR CONTROL 10ML LL (SYRINGE) ×2 IMPLANT
TOWEL OR 17X24 6PK STRL BLUE (TOWEL DISPOSABLE) ×2 IMPLANT
TRACKER ENT INSTRUMENT (MISCELLANEOUS) ×2 IMPLANT
TRACKER ENT PATIENT (MISCELLANEOUS) ×2 IMPLANT
TRAY ENT MC OR (CUSTOM PROCEDURE TRAY) ×2 IMPLANT
TUBE ANAEROBIC SPECIMEN COL (MISCELLANEOUS) ×1 IMPLANT
TUBE CONNECTING 12X1/4 (SUCTIONS) ×2 IMPLANT
TUBE SALEM SUMP 16 FR W/ARV (TUBING) ×2 IMPLANT
TUBING EXTENTION W/L.L. (IV SETS) ×2 IMPLANT
TUBING STRAIGHTSHOT EPS 5PK (TUBING) ×2 IMPLANT
WATER STERILE IRR 1000ML POUR (IV SOLUTION) ×2 IMPLANT
WIPE INSTRUMENT VISIWIPE 73X73 (MISCELLANEOUS) ×2 IMPLANT

## 2014-07-31 NOTE — Op Note (Signed)
NAMCriselda Peaches:  Lee, Christopher          ACCOUNT NO.:  [id number]  MEDICAL RECORD NO.:  [id number]  LOCATION:  [facility]                         FACILITY:  [facility]  PHYSICIAN:  Kinnie Scalesavid L. Annalee GentaShoemaker, M.D.DATE OF BIRTH:  [birth year]  DATE OF PROCEDURE: DATE OF DISCHARGE:  07/31/2014                              OPERATIVE REPORT   PREOPERATIVE DIAGNOSES: 1. Chronic sinusitis. 2. Deviated nasal septum with airway obstruction. 3. Nasal polyposis. 4. Chronic reactive airway disease.  POSTOPERATIVE DIAGNOSES: 1. Chronic sinusitis. 2. Deviated nasal septum with airway obstruction. 3. Nasal polyposis. 4. Chronic reactive airway disease.  INDICATION FOR SURGERY: 1. Chronic sinusitis. 2. Deviated nasal septum with airway obstruction. 3. Nasal polyposis. 4. Chronic reactive airway disease.  PROCEDURE: 1. Bilateral endoscopic sinus surgery with intraoperative computer-     assisted navigation (FuZion) consisting of bilateral total     ethmoidectomies, bilateral maxillary antrostomy with removal of     diseased tissue and left nasal frontal recess exploration. 2. Nasal septoplasty. 3. Bilateral inferior turbinate reduction.  ANESTHESIA:  General endotracheal.  COMPLICATIONS:  None.  ESTIMATED BLOOD LOSS:  Approximately 200 mL.  Patient transferred from operating room to the recovery room in stable condition.  FINDINGS:  Extensive inflammatory sinus mucosal changes with obstruction of the maxillary sinuses bilaterally and chronic-appearing mucopurulent material in the maxillary sinuses, culture and sensitivity obtained. Significant osteitic changes in the bone of the nasal frontal recess, unable to access the right frontal sinus, bilateral Kennedy packs placed at the conclusion of the surgical procedure.  Kenalog/Bactroban slurry instilled in the ethmoid and maxillary sinuses bilaterally.  BRIEF HISTORY:  The patient is a 59 year old male who was referred to our office for evaluation of  recurrent sinusitis.  Nasal airway obstruction and recurrent pneumonias.  He has been followed and treated by his pulmonary physicians and as part of that workup.  A CT scan of the sinus was obtained which showed opacification of the maxillary ethmoid and frontal sinuses.  The patient was treated with broad- spectrum antibiotic therapies, oral and topical steroids and saline nasal irrigation and a followup CT scan was obtained which showed continued opacification of the maxillary and ethmoid sinuses.  The patient was noted to have bony overgrowth and osteitic changes in the inferior aspect of the frontal sinuses bilaterally adjacent to the nasal frontal recess.  Given the patient's history, examination, and findings which shows severely deviated nasal septum and airway obstruction, I recommended the above surgical procedures.  Risks and benefits of the procedure were discussed in detail with the patient's wife and they understood and concurred our plan for surgery which was scheduled on elective basis [facility].  DESCRIPTION OF PROCEDURE:  The patient was brought to the operating room on July 31, 2014, and placed in supine position on the operating table. General endotracheal anesthesia was established without difficulty. When the patient was adequately anesthetized, he was positioned on the operating table and then prepped and draped in sterile fashion.  His nose was injected with a total of 8 mL of 1% lidocaine, 1:100,000 solution of epinephrine which was injected in submucosal fashion along the lateral nasal wall, middle turbinate, inferior turbinate and nasal septum bilaterally.  His nose was  then packed with Afrin-soaked cottonoid pledgets, were left in place for approximately 10 minutes to allow for vasoconstriction, hemostasis.  The Zimmer FuZion headgear was placed and anatomic and surgical landmarks were identified and confirmed.  The navigation device was  used throughout the sinus portion of the surgical procedure.  A time-out was then performed and the patient was positioned prepped and draped for surgery.  With the patient prepared for surgery,  right endoscopic sinus surgery was undertaken using a 0-degree endoscope and a straight microdebrider. The middle turbinate was carefully medialized and the uncinate process was resected in its entirety.  Dissection was then carried from anterior to posterior along the floor of the ethmoid sinus where obstruction and polypoid mucosa was resected.  The patient had dense bone consistent with chronic infection.  Attention was then turned to the lateral nasal wall and using a 45 degree telescope, the natural ostium of the maxillary sinus was identified, this was completely occluded.  Overlying soft tissue was removed with through-cutting forceps and a curved microdebrider.  The right maxillary sinus was filled with thick mucopurulent material which was fully aspirated and then cultured. Copious saline irrigation was used to clear the right maxillary sinus. Attention was then turned to the anterior ethmoid and nasal frontal region.  Using navigation, the region of the natural ostium was identified, but the bone was so thick in this area from osteitic changes that I was unable to access the frontal sinus on the right-hand side.  Attention was then turned to the nasal septum and using right anterior hemitransfixion incision, the nasal septoplasty was performed.  Incision was carried through the mucosa, underlying submucosa and a mucoperichondrial flap was elevated on the right.  The patient had a large bony septal spur which was carefully mobilized preserving the overlying mucosa, then resected.  There was bleeding from the maxillary crest which was cauterized with monopolar suction cautery.  Mid septal cartilage was then removed.  This was later morselized and returned to the mucoperichondrial  pocket.  Dissection was then carried from anterior to posterior removing deviated bone and cartilage to create a midline nasal septum.  At the conclusion of the procedure, the resected cartilage was morselized and returned to the mucoperichondrial pocket. The flaps were reapproximated with a 4-0 gut suture on a Keith needle with a horizontal mattress suture and bilateral Doyle nasal septal splints were placed after the application of Bactroban ointment and sutured position with a 3-0 Ethilon suture. With the septum brought to midline, left endoscopic sinus surgery was undertaken using a 0-degree endoscope and middle turbinate was medialized.  The uncinate process was resected in its entirety and a total ethmoidectomy was performed on the left using the straight microdebrider and a 0-degree endoscope.  The 45 degree telescope was used along the roof of the ethmoid sinus dissecting from posterior to anterior with a curved microdebrider.  The nasal frontal recess was identified and underlying bone and polypoid material was resected. Again, there was significant bony overgrowth in this region, it was difficult to access the sinus, but we were able to open the frontal recess.  Attention turned to the lateral nasal wall where the natural ostium was completely occluded with polypoid material and scar tissue. This was resected with a through-cutting forceps and a microdebrider and again the sinus was filled with mucopurulent material.  This was copiously irrigated and cleared. Inferior turbinate reduction was then performed and cautery set at 12 watts.  Two submucosal passes were made in  each inferior turbinate. When the turbinates had been adequately cauterized small anterior incisions were created, overlying soft tissue, elevated, small amount of turbinate bone was resected, and the turbinates were then outfractured. Sponge count was correct.  The patient's nasal cavity was inspected  and cleared of surgical debris.  There was no active bleeding.  A 50:50 mixture of Kenalog 40 and Bactroban cream was then instilled in the ethmoid and maxillary sinuses bilaterally.  The Doyle nasal septal splints were placed and sutured in position.  The nasopharynx was suctioned.  Orogastric tube was passed.  Stomach contents were aspirated.  The patient was then awakened from his anesthetic.  He was extubated and transferred from the operating room to the recovery room in stable condition.  There were no complications.  Blood loss was approximately 200 mL.          ______________________________ Kinnie Scales. Annalee Genta, M.D.     DLS/MEDQ  D:  78/29/5621  T:  07/31/2014  Job:  [id number]

## 2014-07-31 NOTE — Addendum Note (Signed)
Addendum  created 07/31/14 1847 by Shireen Quanavid R Decarlos Empey, CRNA   Modules edited: Anesthesia Events, Narrator   Narrator:  Narrator: Event Log Edited

## 2014-07-31 NOTE — Anesthesia Procedure Notes (Signed)
Procedure Name: Intubation Date/Time: 07/31/2014 7:44 AM Performed by: Charm BargesBUTLER, Proctor Carriker R Pre-anesthesia Checklist: Patient identified, Emergency Drugs available, Suction available, Patient being monitored and Timeout performed Patient Re-evaluated:Patient Re-evaluated prior to inductionOxygen Delivery Method: Circle system utilized Preoxygenation: Pre-oxygenation with 100% oxygen Intubation Type: IV induction Ventilation: Mask ventilation without difficulty Laryngoscope Size: Mac and 4 Grade View: Grade I Tube type: Oral Tube size: 8.0 mm Number of attempts: 1 Airway Equipment and Method: Stylet Placement Confirmation: ETT inserted through vocal cords under direct vision,  positive ETCO2 and breath sounds checked- equal and bilateral Secured at: 22 cm Tube secured with: Tape Dental Injury: Teeth and Oropharynx as per pre-operative assessment

## 2014-07-31 NOTE — H&P (Signed)
Christopher RichtersStephen R Lee is an 59 y.o. male.   Chief Complaint: chronic sinusitis HPI: Deviated septum and nasal airway obstruction and chronic sinusitis  Past Medical History  Diagnosis Date  . Back pain   . Restless leg syndrome   . Glaucoma   . Hyperlipidemia   . Dermatitis     lower leg  . Spondylosis   . Asthmatic bronchitis , chronic 06/26/2014    Past Surgical History  Procedure Laterality Date  . Back surgery    . Knee surgery    . Shoulder surgery    . Left heart catheterization with coronary angiogram N/A 05/18/2014    Procedure: LEFT HEART CATHETERIZATION WITH CORONARY ANGIOGRAM;  Surgeon: Runell GessJonathan J Berry, MD;  [facility];  Service: Cardiovascular;  Laterality: N/A;  . Neck surgery  2015    Family History  Problem Relation Age of Onset  . Cancer Mother     "blood cancer"  . Leukemia Mother   . CAD Neg Hx   . Diabetes type II Neg Hx    Social History:  reports that he quit smoking about 10 years ago. His smoking use included Cigarettes. He has a 20 pack-year smoking history. He has never used smokeless tobacco. He reports that he does not drink alcohol or use illicit drugs.  Allergies: No Known Allergies  Medications Prior to Admission  Medication Sig Dispense Refill  . atomoxetine (STRATTERA) 100 MG capsule Take 100 mg by mouth daily.    . bimatoprost (LUMIGAN) 0.03 % ophthalmic solution Place 1 drop into both eyes at bedtime.    . clomiPHENE (CLOMID) 50 MG tablet Take 50 mg by mouth daily.    . cyclobenzaprine (FLEXERIL) 10 MG tablet Take 1 tablet (10 mg total) by mouth 2 (two) times daily as needed for muscle spasms. (Patient taking differently: Take 10 mg by mouth every 8 (eight) hours as needed for muscle spasms. ) 20 tablet 0  . famotidine-calcium carbonate-magnesium hydroxide (PEPCID COMPLETE) 10-800-165 MG CHEW chewable tablet Chew 1 tablet by mouth daily as needed (stomach pain).    . mometasone-formoterol (DULERA) 100-5 MCG/ACT AERO Take 2 puffs  first thing in am and then another 2 puffs about 12 hours later. 1 Inhaler 11  . pregabalin (LYRICA) 225 MG capsule Take 225 mg by mouth 2 (two) times daily.    . timolol (TIMOPTIC) 0.25 % ophthalmic solution Place 1 drop into both eyes 2 (two) times daily.    . traMADol (ULTRAM) 50 MG tablet Take 50 mg by mouth every 8 (eight) hours as needed for moderate pain.      No results found for this or any previous visit (from the past 48 hour(s)). No results found.  Review of Systems  Constitutional: Negative.   HENT: Positive for congestion.   Respiratory: Negative.   Cardiovascular: Negative.   Neurological: Positive for headaches.    Blood pressure 131/82, pulse 64, temperature 97.8 F (36.6 C), temperature source Oral, height 5\' 8"  (1.727 m), weight 101.606 kg (224 lb), SpO2 98 %. Physical Exam  Constitutional: He appears well-developed and well-nourished.  HENT:  Deviated septum and nasal airway obstruction  Neck: Normal range of motion. Neck supple.     Assessment/Plan Adm for OP Septo, ESS and turbinate reduction under GA as an OP.  Shelbey Spindler 07/31/2014, 7:32 AM

## 2014-07-31 NOTE — Transfer of Care (Signed)
Immediate Anesthesia Transfer of Care Note  Patient: Christopher Lee  Procedure(s) Performed: Procedure(s): NASAL SEPTOPLASTY WITH BILATERAL TURBINATE REDUCTION (Bilateral) SINUS ENDO WITH FUSION (Bilateral)  Patient Location: PACU  Anesthesia Type:General  Level of Consciousness: awake and oriented  Airway & Oxygen Therapy: Patient Spontanous Breathing and Patient connected to face mask oxygen  Post-op Assessment: Report given to RN, Post -op Vital signs reviewed and stable and Patient moving all extremities  Post vital signs: Reviewed and stable  Last Vitals:  Filed Vitals:   07/31/14 0551  BP: 131/82  Pulse: 64  Temp: 36.6 C    Complications: No apparent anesthesia complications

## 2014-07-31 NOTE — Brief Op Note (Signed)
07/31/2014  9:46 AM  PATIENT:  Christopher Lee  59 y.o. male  PRE-OPERATIVE DIAGNOSIS:  chronic sinusitis  POST-OPERATIVE DIAGNOSIS:  chronic sinusitis  PROCEDURE:  Procedure(s): NASAL SEPTOPLASTY WITH BILATERAL TURBINATE REDUCTION (Bilateral) SINUS ENDO WITH FUSION (Bilateral)  SURGEON:  Surgeon(s) and Role:    * Osborn Cohoavid Rifka Ramey, MD - Primary  PHYSICIAN ASSISTANT:   ASSISTANTS: none   ANESTHESIA:   general  EBL:  Total I/O In: 750 [I.V.:750] Out: - 200  BLOOD ADMINISTERED:none  DRAINS: none   LOCAL MEDICATIONS USED:  LIDOCAINE  and Amount: 8 ml  SPECIMEN:  Source of Specimen:  sinus contents  DISPOSITION OF SPECIMEN:  PATHOLOGY  COUNTS:  YES  TOURNIQUET:  * No tourniquets in log *  DICTATION: .Other Dictation: Dictation Number [id number]  PLAN OF CARE: Discharge to home after PACU  PATIENT DISPOSITION:  PACU - hemodynamically stable.   Delay start of Pharmacological VTE agent (>24hrs) due to surgical blood loss or risk of bleeding: not applicable

## 2014-07-31 NOTE — Anesthesia Preprocedure Evaluation (Signed)
Anesthesia Evaluation  Patient identified by MRN, date of birth, ID band Patient awake    Reviewed: Allergy & Precautions, NPO status , Patient's Chart, lab work & pertinent test results  Airway Mallampati: II  TM Distance: >3 FB Neck ROM: Full    Dental  (+) Teeth Intact, Dental Advisory Given   Pulmonary former smoker,    breath sounds clear to auscultation       Cardiovascular  Rhythm:Regular Rate:Normal     Neuro/Psych    GI/Hepatic   Endo/Other    Renal/GU      Musculoskeletal   Abdominal   Peds  Hematology   Anesthesia Other Findings   Reproductive/Obstetrics                             Anesthesia Physical Anesthesia Plan  ASA: II  Anesthesia Plan: General   Post-op Pain Management:    Induction: Intravenous  Airway Management Planned: Oral ETT  Additional Equipment:   Intra-op Plan:   Post-operative Plan: Extubation in OR  Informed Consent: I have reviewed the patients History and Physical, chart, labs and discussed the procedure including the risks, benefits and alternatives for the proposed anesthesia with the patient or authorized representative who has indicated his/her understanding and acceptance.   Dental advisory given  Plan Discussed with: CRNA and Anesthesiologist  Anesthesia Plan Comments:         Anesthesia Quick Evaluation  

## 2014-07-31 NOTE — Anesthesia Postprocedure Evaluation (Signed)
  Anesthesia Post-op Note  Patient: Christopher Lee  Procedure(s) Performed: Procedure(s): NASAL SEPTOPLASTY WITH BILATERAL TURBINATE REDUCTION (Bilateral) SINUS ENDO WITH FUSION (Bilateral)  Patient Location: PACU  Anesthesia Type:General  Level of Consciousness: awake, alert  and oriented  Airway and Oxygen Therapy: Patient Spontanous Breathing and Patient connected to nasal cannula oxygen  Post-op Pain: mild  Post-op Assessment: Post-op Vital signs reviewed, Patient's Cardiovascular Status Stable, Respiratory Function Stable, Patent Airway and Pain level controlled  Post-op Vital Signs: stable  Last Vitals:  Filed Vitals:   07/31/14 1045  BP: 122/80  Pulse: 75  Temp: 36.6 C  Resp: 16    Complications: No apparent anesthesia complications

## 2014-07-31 NOTE — Progress Notes (Signed)
Expectorates blood tingeed clear sputum occassionally

## 2014-08-02 LAB — WOUND CULTURE

## 2014-08-03 ENCOUNTER — Encounter (HOSPITAL_COMMUNITY): Payer: Self-pay | Admitting: Otolaryngology

## 2014-08-05 LAB — ANAEROBIC CULTURE

## 2014-08-10 ENCOUNTER — Ambulatory Visit: Payer: BLUE CROSS/BLUE SHIELD | Admitting: Internal Medicine

## 2014-08-25 ENCOUNTER — Encounter: Payer: Self-pay | Admitting: Internal Medicine

## 2014-08-25 ENCOUNTER — Ambulatory Visit (INDEPENDENT_AMBULATORY_CARE_PROVIDER_SITE_OTHER): Payer: BLUE CROSS/BLUE SHIELD | Admitting: Internal Medicine

## 2014-08-25 VITALS — BP 166/92 | HR 82 | Ht 68.0 in | Wt 225.4 lb

## 2014-08-25 DIAGNOSIS — J338 Other polyp of sinus: Secondary | ICD-10-CM

## 2014-08-25 DIAGNOSIS — I10 Essential (primary) hypertension: Secondary | ICD-10-CM | POA: Diagnosis not present

## 2014-08-25 DIAGNOSIS — J449 Chronic obstructive pulmonary disease, unspecified: Secondary | ICD-10-CM

## 2014-08-25 DIAGNOSIS — J4489 Other specified chronic obstructive pulmonary disease: Secondary | ICD-10-CM

## 2014-08-25 HISTORY — DX: Essential (primary) hypertension: I10

## 2014-08-25 NOTE — Assessment & Plan Note (Signed)
Minimal elevation noted/ f/u with primary care planned

## 2014-08-25 NOTE — Patient Instructions (Addendum)
We normally recommed Betoptic eyedrops because the timoptic can block the dulera but any changes must be approved and monitored by your eye doctor  Stay on dulera 100 Take 2 puffs first thing in am and then another 2 puffs about 12 hours later.    If you are satisfied with your treatment plan,  let your doctor know and he/she can either refill your medications or you can return here when your prescription runs out.     If in any way you are not 100% satisfied,  please tell us.  If 100% better, tell your friends!  Pulmonary follow up is as needed.

## 2014-08-25 NOTE — Assessment & Plan Note (Addendum)
-  Allergy profile 05/29/2014 > IgE 4 , neg RAST - Spirometry 06/26/14 FEV1  2.45 69%) ratio 65 vs nl spirometry 05/29/14 - note on timolol eyedrops - 06/26/2014   try dulera 100 2bid   I had an extended final summary discussion with the patient reviewing all relevant studies completed to date and  lasting 15 to 20 minutes of a 25 minute visit on the following issues:    The proper method of use, as well as anticipated side effects, of a metered-dose inhaler are discussed and demonstrated to the patient. Improved effectiveness after extensive coaching during this visit to a level of approximately     90%   All goals of chronic asthma control met including optimal function and elimination of symptoms with minimal need for rescue therapy.  Contingencies discussed in full including contacting this office immediately if not controlling the symptoms using the rule of two's.   rec he replace the timolol with betoptic if feasible but defer final call to opthalmology

## 2014-08-25 NOTE — Assessment & Plan Note (Signed)
Sinus Surgery 07/31/14  1. Bilateral endoscopic sinus surgery with intraoperative computer-  assisted navigation [facility]) consisting of bilateral total  ethmoidectomies, bilateral maxillary antrostomy with removal of  diseased tissue and left nasal frontal recess exploration.  F/u by Annalee GentaShoemaker just using NS

## 2014-08-25 NOTE — Progress Notes (Signed)
Subjective:   Patient ID: Christopher Lee, male    DOB: [birth year]   MRN: [id number]    Brief patient profile:  56 yowm quit smoking around 2005 wt  195 with onset nasal obst x 2013, doe x 2014, new resting sob at end of 2015 referred too pulmonary clinic 05/29/14 by Donata Duff and established dx of sinusitis/ AB (see ov 06/26/14 )    History of Present Illness  05/29/2014 1st [facility] office visit/ Racquel Arkin   Chief Complaint  Patient presents with  . Pulmonary Consult    Referred by Dr. Vernie Shanks. Pt c/o SOB for the past year- worse x 2 months. He states he used to run 5 miles, but now can not run at all. Sometimes he gets SOB just with rest.   doe x 100 ft, can't climb ladder s stopping, pattern has been indolent/ progressive doe x 1.5 y but also x 3 months  variable Sob at rest will last x 2 breaths just as likely sitting as supine but never wakes him up Episode of cp > ER > nl LHC and pressures 05/18/14 rec Please see patient coordinator before you leave today  to schedule sinus CT > pos >shoemaker eval  Pantoprazole (protonix) 40 mg   Take 30-60 min before first meal of the day and Pepcid 20 mg one bedtime until return to office - this is the best way to tell whether stomach acid is contributing to your problem.   GERD diet      06/26/2014 f/u ov/Ravenne Wayment re: doe/ cough attributed to sinusitis/ f/u by [facility]  Chief Complaint  Patient presents with  . Follow-up    Pt states his breathing is unchanged since his last visit. No new co's today.   has never used inhalers before, doe is better but still sob x 100 ft, sinus symptoms no better and considering sinus surgery   Cough is congested/ slt discolored mucus esp in am's rec  Dulera 100 Take 2 puffs first thing in am and then another 2 puffs about 12 hours later.  Stop the acid suppressors   07/31/14 - Sinus surgery Shoemaker, all benign chronic sinusitis on path     08/25/2014 f/u ov/Demetri Kerman re: asthma/ sinusitis s/p surgery    Chief Complaint  Patient presents with  . Follow-up    Pt states his breathing is overall doing well. Had nasal surgery approx 3 1/2 wks ago.    Not limited by breathing from desired activities/ starting to work out on treadmill again    No obvious day to day or daytime variabilty or assoc cough or cp or chest tightness, subjective wheeze overt   hb symptoms. No unusual exp hx or h/o childhood pna/ asthma or knowledge of premature birth.  Sleeping ok without nocturnal  or early am exacerbation  of respiratory  c/o's or need for noct saba. Also denies any obvious fluctuation of symptoms with weather or environmental changes or other aggravating or alleviating factors except as outlined above   Current Medications, Allergies, Complete Past Medical History, Past Surgical History, Family History, and Social History were reviewed in [employer] record.  ROS  The following are not active complaints unless bolded sore throat, dysphagia, dental problems, itching, sneezing,  nasal congestion or excess/ purulent secretions, ear ache,   fever, chills, sweats, unintended wt loss, pleuritic or exertional cp, hemoptysis,  orthopnea pnd or leg swelling, presyncope, palpitations, heartburn, abdominal pain, anorexia, nausea, vomiting, diarrhea  or change in bowel  or urinary habits, change in stools or urine, dysuria,hematuria,  rash, arthralgias, visual complaints, headache, numbness weakness or ataxia or problems with walking or coordination,  change in mood/affect or memory.                    Objective:   Physical Exam  amb wm  nad mild nasal tone to voice   06/26/2014       230  > 225 08/25/2014  Wt Readings from Last 3 Encounters:  05/29/14 228 lb (103.42 kg)  05/19/14 217 lb 11.2 oz (98.748 kg)  03/10/13 220 lb (99.791 kg)    Vital signs reviewed/ bp elevation noted   HEENT: nl dentition,    Nl external ear canals without cough reflex   NECK :  without JVD/Nodes/TM/ nl  carotid upstrokes bilaterally   LUNGS: no acc muscle use, clear to A and P bilaterally without cough on insp or exp maneuvers   CV:  RRR  no s3 or murmur or increase in P2, no edema   ABD:  soft and nontender with nl excursion in the supine position. No bruits or organomegaly, bowel sounds nl  MS:  warm without deformities, calf tenderness, cyanosis or clubbing  SKIN: warm and dry without lesions    NEURO:  alert, approp, no deficits    CTa 05/19/14  Images reviewed No evidence of pulmonary embolus. Scattered areas of atelectasis in the lower lobes bilaterally, right greater than left.           Assessment & Plan:

## 2014-08-26 ENCOUNTER — Other Ambulatory Visit: Payer: Self-pay | Admitting: Internal Medicine
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
## 2015-09-13 DIAGNOSIS — J209 Acute bronchitis, unspecified: Secondary | ICD-10-CM | POA: Diagnosis not present

## 2015-12-29 DIAGNOSIS — Z1211 Encounter for screening for malignant neoplasm of colon: Secondary | ICD-10-CM | POA: Diagnosis not present

## 2015-12-29 DIAGNOSIS — R413 Other amnesia: Secondary | ICD-10-CM | POA: Diagnosis not present

## 2015-12-29 DIAGNOSIS — Z0000 Encounter for general adult medical examination without abnormal findings: Secondary | ICD-10-CM | POA: Diagnosis not present

## 2015-12-29 DIAGNOSIS — E785 Hyperlipidemia, unspecified: Secondary | ICD-10-CM | POA: Diagnosis not present

## 2015-12-29 DIAGNOSIS — Z23 Encounter for immunization: Secondary | ICD-10-CM | POA: Diagnosis not present

## 2015-12-31 DIAGNOSIS — E538 Deficiency of other specified B group vitamins: Secondary | ICD-10-CM | POA: Diagnosis not present

## 2016-01-03 DIAGNOSIS — E538 Deficiency of other specified B group vitamins: Secondary | ICD-10-CM | POA: Diagnosis not present

## 2016-01-04 DIAGNOSIS — E538 Deficiency of other specified B group vitamins: Secondary | ICD-10-CM | POA: Diagnosis not present

## 2016-01-05 DIAGNOSIS — E538 Deficiency of other specified B group vitamins: Secondary | ICD-10-CM | POA: Diagnosis not present

## 2016-01-06 DIAGNOSIS — E538 Deficiency of other specified B group vitamins: Secondary | ICD-10-CM | POA: Diagnosis not present

## 2016-01-13 DIAGNOSIS — E538 Deficiency of other specified B group vitamins: Secondary | ICD-10-CM | POA: Diagnosis not present

## 2016-01-20 DIAGNOSIS — E538 Deficiency of other specified B group vitamins: Secondary | ICD-10-CM | POA: Diagnosis not present

## 2016-01-27 DIAGNOSIS — E538 Deficiency of other specified B group vitamins: Secondary | ICD-10-CM | POA: Diagnosis not present

## 2016-02-02 DIAGNOSIS — H401131 Primary open-angle glaucoma, bilateral, mild stage: Secondary | ICD-10-CM | POA: Diagnosis not present

## 2016-02-02 DIAGNOSIS — E538 Deficiency of other specified B group vitamins: Secondary | ICD-10-CM | POA: Diagnosis not present

## 2016-04-14 DIAGNOSIS — E538 Deficiency of other specified B group vitamins: Secondary | ICD-10-CM | POA: Diagnosis not present

## 2016-05-15 DIAGNOSIS — E538 Deficiency of other specified B group vitamins: Secondary | ICD-10-CM | POA: Diagnosis not present

## 2016-06-27 DIAGNOSIS — E291 Testicular hypofunction: Secondary | ICD-10-CM | POA: Diagnosis not present

## 2016-06-27 DIAGNOSIS — E538 Deficiency of other specified B group vitamins: Secondary | ICD-10-CM | POA: Diagnosis not present

## 2016-06-27 DIAGNOSIS — H409 Unspecified glaucoma: Secondary | ICD-10-CM | POA: Diagnosis not present

## 2016-06-27 DIAGNOSIS — R202 Paresthesia of skin: Secondary | ICD-10-CM | POA: Diagnosis not present

## 2016-07-12 DIAGNOSIS — M1990 Unspecified osteoarthritis, unspecified site: Secondary | ICD-10-CM | POA: Diagnosis not present

## 2016-07-12 DIAGNOSIS — R202 Paresthesia of skin: Secondary | ICD-10-CM | POA: Diagnosis not present

## 2016-07-12 DIAGNOSIS — E538 Deficiency of other specified B group vitamins: Secondary | ICD-10-CM | POA: Diagnosis not present

## 2016-08-29 DIAGNOSIS — E538 Deficiency of other specified B group vitamins: Secondary | ICD-10-CM | POA: Diagnosis not present

## 2016-09-07 DIAGNOSIS — R3912 Poor urinary stream: Secondary | ICD-10-CM | POA: Diagnosis not present

## 2016-09-07 DIAGNOSIS — N401 Enlarged prostate with lower urinary tract symptoms: Secondary | ICD-10-CM | POA: Diagnosis not present

## 2016-09-07 DIAGNOSIS — N5201 Erectile dysfunction due to arterial insufficiency: Secondary | ICD-10-CM | POA: Diagnosis not present

## 2016-09-07 DIAGNOSIS — E291 Testicular hypofunction: Secondary | ICD-10-CM | POA: Diagnosis not present

## 2016-09-07 DIAGNOSIS — Z125 Encounter for screening for malignant neoplasm of prostate: Secondary | ICD-10-CM | POA: Diagnosis not present

## 2017-01-18 DIAGNOSIS — R3912 Poor urinary stream: Secondary | ICD-10-CM | POA: Diagnosis not present

## 2017-01-18 DIAGNOSIS — E291 Testicular hypofunction: Secondary | ICD-10-CM | POA: Diagnosis not present

## 2017-01-18 DIAGNOSIS — N401 Enlarged prostate with lower urinary tract symptoms: Secondary | ICD-10-CM | POA: Diagnosis not present

## 2017-01-25 DIAGNOSIS — Z23 Encounter for immunization: Secondary | ICD-10-CM | POA: Diagnosis not present

## 2017-02-22 DIAGNOSIS — E785 Hyperlipidemia, unspecified: Secondary | ICD-10-CM | POA: Diagnosis not present

## 2017-02-22 DIAGNOSIS — E538 Deficiency of other specified B group vitamins: Secondary | ICD-10-CM | POA: Diagnosis not present

## 2017-02-22 DIAGNOSIS — Z0000 Encounter for general adult medical examination without abnormal findings: Secondary | ICD-10-CM | POA: Diagnosis not present

## 2017-02-22 DIAGNOSIS — Z1211 Encounter for screening for malignant neoplasm of colon: Secondary | ICD-10-CM | POA: Diagnosis not present

## 2017-07-19 DIAGNOSIS — H401131 Primary open-angle glaucoma, bilateral, mild stage: Secondary | ICD-10-CM | POA: Diagnosis not present

## 2017-07-24 DIAGNOSIS — N5201 Erectile dysfunction due to arterial insufficiency: Secondary | ICD-10-CM | POA: Diagnosis not present

## 2017-07-24 DIAGNOSIS — E291 Testicular hypofunction: Secondary | ICD-10-CM | POA: Diagnosis not present

## 2017-07-24 DIAGNOSIS — R3912 Poor urinary stream: Secondary | ICD-10-CM | POA: Diagnosis not present

## 2017-07-24 DIAGNOSIS — N401 Enlarged prostate with lower urinary tract symptoms: Secondary | ICD-10-CM | POA: Diagnosis not present

## 2017-09-07 DIAGNOSIS — E291 Testicular hypofunction: Secondary | ICD-10-CM | POA: Diagnosis not present

## 2017-09-07 DIAGNOSIS — E538 Deficiency of other specified B group vitamins: Secondary | ICD-10-CM | POA: Diagnosis not present

## 2017-09-07 DIAGNOSIS — H409 Unspecified glaucoma: Secondary | ICD-10-CM | POA: Diagnosis not present

## 2017-09-07 DIAGNOSIS — R5383 Other fatigue: Secondary | ICD-10-CM | POA: Diagnosis not present

## 2017-09-07 DIAGNOSIS — E785 Hyperlipidemia, unspecified: Secondary | ICD-10-CM | POA: Diagnosis not present

## 2018-01-21 DIAGNOSIS — N401 Enlarged prostate with lower urinary tract symptoms: Secondary | ICD-10-CM | POA: Diagnosis not present

## 2018-01-21 DIAGNOSIS — N5201 Erectile dysfunction due to arterial insufficiency: Secondary | ICD-10-CM | POA: Diagnosis not present

## 2018-01-21 DIAGNOSIS — R351 Nocturia: Secondary | ICD-10-CM | POA: Diagnosis not present

## 2018-01-21 DIAGNOSIS — E291 Testicular hypofunction: Secondary | ICD-10-CM | POA: Diagnosis not present

## 2018-02-06 DIAGNOSIS — H401131 Primary open-angle glaucoma, bilateral, mild stage: Secondary | ICD-10-CM | POA: Diagnosis not present

## 2018-02-14 DIAGNOSIS — M549 Dorsalgia, unspecified: Secondary | ICD-10-CM | POA: Diagnosis not present

## 2018-03-15 DIAGNOSIS — E538 Deficiency of other specified B group vitamins: Secondary | ICD-10-CM | POA: Diagnosis not present

## 2018-03-15 DIAGNOSIS — Z0000 Encounter for general adult medical examination without abnormal findings: Secondary | ICD-10-CM | POA: Diagnosis not present

## 2018-03-15 DIAGNOSIS — Z23 Encounter for immunization: Secondary | ICD-10-CM | POA: Diagnosis not present

## 2018-07-22 DIAGNOSIS — E291 Testicular hypofunction: Secondary | ICD-10-CM | POA: Diagnosis not present

## 2018-07-22 DIAGNOSIS — R3912 Poor urinary stream: Secondary | ICD-10-CM | POA: Diagnosis not present

## 2018-07-22 DIAGNOSIS — N5201 Erectile dysfunction due to arterial insufficiency: Secondary | ICD-10-CM | POA: Diagnosis not present

## 2018-09-13 DIAGNOSIS — E538 Deficiency of other specified B group vitamins: Secondary | ICD-10-CM | POA: Diagnosis not present

## 2018-09-13 DIAGNOSIS — E291 Testicular hypofunction: Secondary | ICD-10-CM | POA: Diagnosis not present

## 2018-09-13 DIAGNOSIS — J45909 Unspecified asthma, uncomplicated: Secondary | ICD-10-CM | POA: Diagnosis not present

## 2018-09-13 DIAGNOSIS — M1990 Unspecified osteoarthritis, unspecified site: Secondary | ICD-10-CM | POA: Diagnosis not present

## 2018-10-09 DIAGNOSIS — E538 Deficiency of other specified B group vitamins: Secondary | ICD-10-CM | POA: Diagnosis not present

## 2018-10-09 DIAGNOSIS — R413 Other amnesia: Secondary | ICD-10-CM | POA: Diagnosis not present

## 2018-10-17 DIAGNOSIS — R413 Other amnesia: Secondary | ICD-10-CM | POA: Diagnosis not present

## 2018-10-17 DIAGNOSIS — E538 Deficiency of other specified B group vitamins: Secondary | ICD-10-CM | POA: Diagnosis not present

## 2018-11-07 DIAGNOSIS — H401131 Primary open-angle glaucoma, bilateral, mild stage: Secondary | ICD-10-CM | POA: Diagnosis not present

## 2018-11-19 ENCOUNTER — Ambulatory Visit: Payer: BC Managed Care – PPO | Admitting: Neurology

## 2018-11-19 ENCOUNTER — Encounter: Payer: Self-pay | Admitting: Neurology

## 2018-11-19 ENCOUNTER — Other Ambulatory Visit: Payer: Self-pay

## 2018-11-19 DIAGNOSIS — R413 Other amnesia: Secondary | ICD-10-CM

## 2018-11-19 HISTORY — DX: Other amnesia: R41.3
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
## 2018-11-20 ENCOUNTER — Telehealth: Payer: Self-pay | Admitting: Neurology

## 2018-11-20 NOTE — Telephone Encounter (Signed)
[employer] Auth: [id number] (exp. 11/19/18 to 05/17/19). I spoke to the patient he stated he is going to hold off right now because he is going to talk to one of his other doctors about getting his medicine changed first to see if that would help.

## 2019-01-31 DIAGNOSIS — E291 Testicular hypofunction: Secondary | ICD-10-CM | POA: Diagnosis not present

## 2019-01-31 DIAGNOSIS — R351 Nocturia: Secondary | ICD-10-CM | POA: Diagnosis not present

## 2019-01-31 DIAGNOSIS — N5201 Erectile dysfunction due to arterial insufficiency: Secondary | ICD-10-CM | POA: Diagnosis not present

## 2019-01-31 DIAGNOSIS — N401 Enlarged prostate with lower urinary tract symptoms: Secondary | ICD-10-CM | POA: Diagnosis not present

## 2019-04-07 DIAGNOSIS — Z1159 Encounter for screening for other viral diseases: Secondary | ICD-10-CM | POA: Diagnosis not present

## 2019-04-10 DIAGNOSIS — K5289 Other specified noninfective gastroenteritis and colitis: Secondary | ICD-10-CM | POA: Diagnosis not present

## 2019-04-10 DIAGNOSIS — K6389 Other specified diseases of intestine: Secondary | ICD-10-CM | POA: Diagnosis not present

## 2019-04-10 DIAGNOSIS — Z1211 Encounter for screening for malignant neoplasm of colon: Secondary | ICD-10-CM | POA: Diagnosis not present

## 2019-04-10 DIAGNOSIS — D125 Benign neoplasm of sigmoid colon: Secondary | ICD-10-CM | POA: Diagnosis not present

## 2019-04-17 DIAGNOSIS — Z0000 Encounter for general adult medical examination without abnormal findings: Secondary | ICD-10-CM | POA: Diagnosis not present

## 2019-05-07 DIAGNOSIS — Z0000 Encounter for general adult medical examination without abnormal findings: Secondary | ICD-10-CM | POA: Diagnosis not present

## 2019-05-07 DIAGNOSIS — E538 Deficiency of other specified B group vitamins: Secondary | ICD-10-CM | POA: Diagnosis not present

## 2019-05-07 DIAGNOSIS — Z13220 Encounter for screening for lipoid disorders: Secondary | ICD-10-CM | POA: Diagnosis not present

## 2019-06-19 DIAGNOSIS — M549 Dorsalgia, unspecified: Secondary | ICD-10-CM | POA: Diagnosis not present

## 2019-10-27 DIAGNOSIS — E538 Deficiency of other specified B group vitamins: Secondary | ICD-10-CM | POA: Diagnosis not present

## 2019-10-27 DIAGNOSIS — L309 Dermatitis, unspecified: Secondary | ICD-10-CM | POA: Diagnosis not present

## 2019-10-27 DIAGNOSIS — E785 Hyperlipidemia, unspecified: Secondary | ICD-10-CM | POA: Diagnosis not present

## 2019-10-27 DIAGNOSIS — G2581 Restless legs syndrome: Secondary | ICD-10-CM | POA: Diagnosis not present

## 2020-01-22 DIAGNOSIS — G2581 Restless legs syndrome: Secondary | ICD-10-CM | POA: Diagnosis not present

## 2020-01-22 DIAGNOSIS — M549 Dorsalgia, unspecified: Secondary | ICD-10-CM | POA: Diagnosis not present

## 2020-02-12 DIAGNOSIS — H401131 Primary open-angle glaucoma, bilateral, mild stage: Secondary | ICD-10-CM | POA: Diagnosis not present

## 2020-03-17 DIAGNOSIS — M25552 Pain in left hip: Secondary | ICD-10-CM | POA: Diagnosis not present

## 2020-03-17 DIAGNOSIS — R030 Elevated blood-pressure reading, without diagnosis of hypertension: Secondary | ICD-10-CM | POA: Diagnosis not present

## 2020-03-17 DIAGNOSIS — G2581 Restless legs syndrome: Secondary | ICD-10-CM | POA: Diagnosis not present

## 2020-03-17 DIAGNOSIS — M549 Dorsalgia, unspecified: Secondary | ICD-10-CM | POA: Diagnosis not present

## 2020-03-17 DIAGNOSIS — Z23 Encounter for immunization: Secondary | ICD-10-CM | POA: Diagnosis not present

## 2020-03-22 DIAGNOSIS — M545 Low back pain, unspecified: Secondary | ICD-10-CM | POA: Diagnosis not present

## 2020-03-22 DIAGNOSIS — M25552 Pain in left hip: Secondary | ICD-10-CM | POA: Diagnosis not present

## 2020-03-27 DIAGNOSIS — M25552 Pain in left hip: Secondary | ICD-10-CM | POA: Diagnosis not present

## 2020-03-27 DIAGNOSIS — M545 Low back pain, unspecified: Secondary | ICD-10-CM | POA: Diagnosis not present

## 2020-03-30 DIAGNOSIS — M25552 Pain in left hip: Secondary | ICD-10-CM | POA: Diagnosis not present

## 2020-03-30 DIAGNOSIS — M545 Low back pain, unspecified: Secondary | ICD-10-CM | POA: Diagnosis not present

## 2020-04-07 DIAGNOSIS — M545 Low back pain, unspecified: Secondary | ICD-10-CM | POA: Diagnosis not present

## 2020-04-07 DIAGNOSIS — M47812 Spondylosis without myelopathy or radiculopathy, cervical region: Secondary | ICD-10-CM | POA: Diagnosis not present

## 2020-04-07 DIAGNOSIS — Z6835 Body mass index (BMI) 35.0-35.9, adult: Secondary | ICD-10-CM | POA: Diagnosis not present

## 2020-04-14 DIAGNOSIS — M25552 Pain in left hip: Secondary | ICD-10-CM | POA: Diagnosis not present

## 2020-04-15 DIAGNOSIS — M5416 Radiculopathy, lumbar region: Secondary | ICD-10-CM | POA: Diagnosis not present

## 2020-04-15 DIAGNOSIS — G5712 Meralgia paresthetica, left lower limb: Secondary | ICD-10-CM | POA: Diagnosis not present

## 2020-04-15 DIAGNOSIS — I10 Essential (primary) hypertension: Secondary | ICD-10-CM | POA: Diagnosis not present

## 2020-04-15 DIAGNOSIS — Z6832 Body mass index (BMI) 32.0-32.9, adult: Secondary | ICD-10-CM | POA: Diagnosis not present

## 2020-04-28 DIAGNOSIS — J988 Other specified respiratory disorders: Secondary | ICD-10-CM | POA: Diagnosis not present

## 2020-04-28 DIAGNOSIS — M549 Dorsalgia, unspecified: Secondary | ICD-10-CM | POA: Diagnosis not present

## 2020-04-28 DIAGNOSIS — R062 Wheezing: Secondary | ICD-10-CM | POA: Diagnosis not present

## 2020-04-29 DIAGNOSIS — U071 COVID-19: Secondary | ICD-10-CM | POA: Diagnosis not present

## 2020-05-17 DIAGNOSIS — M25552 Pain in left hip: Secondary | ICD-10-CM | POA: Diagnosis not present

## 2020-05-19 DIAGNOSIS — M5416 Radiculopathy, lumbar region: Secondary | ICD-10-CM | POA: Diagnosis not present

## 2020-05-21 DIAGNOSIS — D649 Anemia, unspecified: Secondary | ICD-10-CM | POA: Diagnosis not present

## 2020-05-21 DIAGNOSIS — E785 Hyperlipidemia, unspecified: Secondary | ICD-10-CM | POA: Diagnosis not present

## 2020-05-21 DIAGNOSIS — E538 Deficiency of other specified B group vitamins: Secondary | ICD-10-CM | POA: Diagnosis not present

## 2020-05-25 DIAGNOSIS — Z23 Encounter for immunization: Secondary | ICD-10-CM | POA: Diagnosis not present

## 2020-05-25 DIAGNOSIS — Z0000 Encounter for general adult medical examination without abnormal findings: Secondary | ICD-10-CM | POA: Diagnosis not present

## 2020-06-02 DIAGNOSIS — Z6832 Body mass index (BMI) 32.0-32.9, adult: Secondary | ICD-10-CM | POA: Diagnosis not present

## 2020-06-02 DIAGNOSIS — M5416 Radiculopathy, lumbar region: Secondary | ICD-10-CM | POA: Diagnosis not present

## 2020-06-02 DIAGNOSIS — I10 Essential (primary) hypertension: Secondary | ICD-10-CM | POA: Diagnosis not present

## 2020-06-02 DIAGNOSIS — G5712 Meralgia paresthetica, left lower limb: Secondary | ICD-10-CM | POA: Diagnosis not present

## 2020-06-08 DIAGNOSIS — M5416 Radiculopathy, lumbar region: Secondary | ICD-10-CM | POA: Diagnosis not present

## 2020-07-10 ENCOUNTER — Encounter (HOSPITAL_COMMUNITY): Payer: Self-pay | Admitting: Emergency Medicine

## 2020-07-10 ENCOUNTER — Other Ambulatory Visit: Payer: Self-pay

## 2020-07-10 ENCOUNTER — Emergency Department (HOSPITAL_COMMUNITY): Payer: BC Managed Care – PPO

## 2020-07-10 ENCOUNTER — Emergency Department (HOSPITAL_COMMUNITY)
Admission: EM | Admit: 2020-07-10 | Discharge: 2020-07-10 | Disposition: A | Discharge status: Home / Self Care | Payer: BC Managed Care – PPO | Attending: Emergency Medicine | Admitting: Emergency Medicine

## 2020-07-10 DIAGNOSIS — S68622A Partial traumatic transphalangeal amputation of right middle finger, initial encounter: Secondary | ICD-10-CM | POA: Diagnosis not present

## 2020-07-10 DIAGNOSIS — J45909 Unspecified asthma, uncomplicated: Secondary | ICD-10-CM | POA: Insufficient documentation

## 2020-07-10 DIAGNOSIS — S61212A Laceration without foreign body of right middle finger without damage to nail, initial encounter: Secondary | ICD-10-CM | POA: Diagnosis not present

## 2020-07-10 DIAGNOSIS — Z79899 Other long term (current) drug therapy: Secondary | ICD-10-CM | POA: Insufficient documentation

## 2020-07-10 DIAGNOSIS — I10 Essential (primary) hypertension: Secondary | ICD-10-CM | POA: Insufficient documentation

## 2020-07-10 DIAGNOSIS — S6991XA Unspecified injury of right wrist, hand and finger(s), initial encounter: Secondary | ICD-10-CM | POA: Diagnosis not present

## 2020-07-10 DIAGNOSIS — W270XXA Contact with workbench tool, initial encounter: Secondary | ICD-10-CM | POA: Diagnosis not present

## 2020-07-10 DIAGNOSIS — Z87891 Personal history of nicotine dependence: Secondary | ICD-10-CM | POA: Insufficient documentation

## 2020-07-10 MED ORDER — CEPHALEXIN 500 MG PO CAPS: 500.0000 mg | ORAL_CAPSULE | Freq: Two times a day (BID) | ORAL | 0 refills | Status: AC

## 2020-07-10 MED ORDER — BACITRACIN ZINC 500 UNIT/GM EX OINT
1.0000 "application " | TOPICAL_OINTMENT | Freq: Two times a day (BID) | CUTANEOUS | Status: DC
  Administered 2020-07-10: 1 via TOPICAL
  Filled 2020-07-10: qty 0.9

## 2020-07-10 MED ORDER — CEPHALEXIN 250 MG PO CAPS
500.0000 mg | ORAL_CAPSULE | Freq: Once | ORAL | Status: AC
  Administered 2020-07-10: 500 mg via ORAL
  Filled 2020-07-10: qty 2

## 2020-07-10 MED ORDER — OXYCODONE-ACETAMINOPHEN 5-325 MG PO TABS
1.0000 | ORAL_TABLET | Freq: Once | ORAL | Status: AC
  Administered 2020-07-10: 1 via ORAL
  Filled 2020-07-10: qty 1
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
## 2020-07-10 NOTE — ED Triage Notes (Signed)
Amputation to tip of R 3rd digit from a table saw 2 hours ago.  DT 1 month ago.

## 2020-07-10 NOTE — ED Provider Notes (Addendum)
Patient placed in Quick Look pathway, seen and evaluated   Chief Complaint: finger laceration  HPI:   65 y/o M presenting for eval of right finger laceration/partial amputation that occurred while using a table saw PTA. Pt right handed. Tdap UTD.   ROS: finger laceration (one)  Physical Exam:   Gen: No distress  Neuro: Awake and Alert  Skin: Warm    Focused Exam: partial amputation of right long finger (pictures in media tab)   Initiation of care has begun. The patient has been counseled on the process, plan, and necessity for staying for the completion/evaluation, and the remainder of the medical screening examination  MSE was initiated and I personally evaluated the patient and placed orders (if any) at  2:55 PM on July 10, 2020.  The patient appears stable so that the remainder of the MSE may be completed by another provider.    Karrie Meres, PA-C 07/10/20 1456    Karrie Meres, PA-C 07/10/20 1502    Gwyneth Sprout, MD 07/10/20 2129

## 2020-07-10 NOTE — ED Notes (Signed)
Bandage in place by PA.

## 2020-07-10 NOTE — Discharge Instructions (Signed)
As discussed, you need to follow-up with her orthopedic surgeon in the clinic.  If office does not contact you, please call on Monday to be seen.  In the interim, monitor your condition carefully and do not hesitate to return if you develop new, or concerning changes.  In addition to your antibiotics, please use Tylenol and ibuprofen for pain control.

## 2020-07-13 DIAGNOSIS — S68122A Partial traumatic metacarpophalangeal amputation of right middle finger, initial encounter: Secondary | ICD-10-CM | POA: Diagnosis not present

## 2020-07-13 DIAGNOSIS — M79644 Pain in right finger(s): Secondary | ICD-10-CM | POA: Diagnosis not present

## 2020-07-14 DIAGNOSIS — M5416 Radiculopathy, lumbar region: Secondary | ICD-10-CM | POA: Diagnosis not present

## 2020-07-14 DIAGNOSIS — M4316 Spondylolisthesis, lumbar region: Secondary | ICD-10-CM | POA: Diagnosis not present

## 2020-07-14 DIAGNOSIS — M5136 Other intervertebral disc degeneration, lumbar region: Secondary | ICD-10-CM | POA: Diagnosis not present

## 2020-07-14 DIAGNOSIS — G5712 Meralgia paresthetica, left lower limb: Secondary | ICD-10-CM | POA: Diagnosis not present

## 2020-07-14 DIAGNOSIS — M4326 Fusion of spine, lumbar region: Secondary | ICD-10-CM | POA: Diagnosis not present

## 2020-07-20 DIAGNOSIS — Z01818 Encounter for other preprocedural examination: Secondary | ICD-10-CM | POA: Diagnosis not present

## 2020-07-20 DIAGNOSIS — M549 Dorsalgia, unspecified: Secondary | ICD-10-CM | POA: Diagnosis not present

## 2020-07-22 ENCOUNTER — Other Ambulatory Visit: Payer: Self-pay | Admitting: Orthopaedic Surgery

## 2020-07-22 DIAGNOSIS — M47896 Other spondylosis, lumbar region: Secondary | ICD-10-CM

## 2020-07-22 DIAGNOSIS — M5416 Radiculopathy, lumbar region: Secondary | ICD-10-CM

## 2020-07-28 ENCOUNTER — Other Ambulatory Visit: Payer: Self-pay

## 2020-07-28 ENCOUNTER — Ambulatory Visit
Admission: RE | Admit: 2020-07-28 | Discharge: 2020-07-28 | Disposition: A | Discharge status: Home / Self Care | Payer: BC Managed Care – PPO | Source: Ambulatory Visit | Attending: Orthopaedic Surgery | Admitting: Orthopaedic Surgery

## 2020-07-28 DIAGNOSIS — M5124 Other intervertebral disc displacement, thoracic region: Secondary | ICD-10-CM | POA: Diagnosis not present

## 2020-07-28 DIAGNOSIS — M47896 Other spondylosis, lumbar region: Secondary | ICD-10-CM

## 2020-07-28 DIAGNOSIS — M4804 Spinal stenosis, thoracic region: Secondary | ICD-10-CM | POA: Diagnosis not present

## 2020-07-28 DIAGNOSIS — M47814 Spondylosis without myelopathy or radiculopathy, thoracic region: Secondary | ICD-10-CM | POA: Diagnosis not present

## 2020-07-28 DIAGNOSIS — M5416 Radiculopathy, lumbar region: Secondary | ICD-10-CM

## 2020-09-17 DIAGNOSIS — M48062 Spinal stenosis, lumbar region with neurogenic claudication: Secondary | ICD-10-CM | POA: Diagnosis not present

## 2020-09-17 DIAGNOSIS — Z01818 Encounter for other preprocedural examination: Secondary | ICD-10-CM | POA: Diagnosis not present

## 2020-09-17 DIAGNOSIS — M5106 Intervertebral disc disorders with myelopathy, lumbar region: Secondary | ICD-10-CM | POA: Diagnosis not present

## 2020-09-17 DIAGNOSIS — M5126 Other intervertebral disc displacement, lumbar region: Secondary | ICD-10-CM | POA: Diagnosis not present

## 2020-09-17 DIAGNOSIS — M5416 Radiculopathy, lumbar region: Secondary | ICD-10-CM | POA: Diagnosis not present

## 2020-09-17 DIAGNOSIS — M545 Low back pain, unspecified: Secondary | ICD-10-CM | POA: Diagnosis not present

## 2020-09-17 DIAGNOSIS — M4326 Fusion of spine, lumbar region: Secondary | ICD-10-CM | POA: Diagnosis not present

## 2020-09-17 DIAGNOSIS — Z4689 Encounter for fitting and adjustment of other specified devices: Secondary | ICD-10-CM | POA: Diagnosis not present

## 2020-09-17 DIAGNOSIS — M4010 Other secondary kyphosis, site unspecified: Secondary | ICD-10-CM | POA: Diagnosis not present

## 2020-09-17 DIAGNOSIS — M532X6 Spinal instabilities, lumbar region: Secondary | ICD-10-CM | POA: Diagnosis not present

## 2020-10-05 DIAGNOSIS — M4326 Fusion of spine, lumbar region: Secondary | ICD-10-CM | POA: Diagnosis not present

## 2020-10-05 DIAGNOSIS — M40205 Unspecified kyphosis, thoracolumbar region: Secondary | ICD-10-CM | POA: Diagnosis not present

## 2020-10-05 DIAGNOSIS — M4186 Other forms of scoliosis, lumbar region: Secondary | ICD-10-CM | POA: Diagnosis not present

## 2020-10-05 DIAGNOSIS — M1612 Unilateral primary osteoarthritis, left hip: Secondary | ICD-10-CM | POA: Diagnosis not present

## 2020-10-05 DIAGNOSIS — G96198 Other disorders of meninges, not elsewhere classified: Secondary | ICD-10-CM | POA: Diagnosis not present

## 2020-10-05 DIAGNOSIS — Z9889 Other specified postprocedural states: Secondary | ICD-10-CM | POA: Diagnosis not present

## 2020-10-05 DIAGNOSIS — M532X6 Spinal instabilities, lumbar region: Secondary | ICD-10-CM | POA: Diagnosis not present

## 2020-10-05 DIAGNOSIS — M48062 Spinal stenosis, lumbar region with neurogenic claudication: Secondary | ICD-10-CM | POA: Diagnosis not present

## 2020-10-05 DIAGNOSIS — R531 Weakness: Secondary | ICD-10-CM | POA: Diagnosis not present

## 2020-10-05 DIAGNOSIS — M4316 Spondylolisthesis, lumbar region: Secondary | ICD-10-CM | POA: Diagnosis not present

## 2020-10-05 DIAGNOSIS — M5106 Intervertebral disc disorders with myelopathy, lumbar region: Secondary | ICD-10-CM | POA: Diagnosis not present

## 2020-10-05 DIAGNOSIS — Z981 Arthrodesis status: Secondary | ICD-10-CM | POA: Diagnosis not present

## 2020-10-05 DIAGNOSIS — I440 Atrioventricular block, first degree: Secondary | ICD-10-CM | POA: Diagnosis not present

## 2020-10-05 DIAGNOSIS — Z7951 Long term (current) use of inhaled steroids: Secondary | ICD-10-CM | POA: Diagnosis not present

## 2020-10-05 DIAGNOSIS — M4010 Other secondary kyphosis, site unspecified: Secondary | ICD-10-CM | POA: Diagnosis not present

## 2020-10-05 DIAGNOSIS — Z87891 Personal history of nicotine dependence: Secondary | ICD-10-CM | POA: Diagnosis not present

## 2020-10-05 DIAGNOSIS — Z01818 Encounter for other preprocedural examination: Secondary | ICD-10-CM | POA: Diagnosis not present

## 2020-10-05 DIAGNOSIS — M5116 Intervertebral disc disorders with radiculopathy, lumbar region: Secondary | ICD-10-CM | POA: Diagnosis not present

## 2020-10-05 DIAGNOSIS — M48061 Spinal stenosis, lumbar region without neurogenic claudication: Secondary | ICD-10-CM | POA: Diagnosis not present

## 2020-11-04 DIAGNOSIS — M48062 Spinal stenosis, lumbar region with neurogenic claudication: Secondary | ICD-10-CM | POA: Diagnosis not present

## 2020-11-19 DIAGNOSIS — E785 Hyperlipidemia, unspecified: Secondary | ICD-10-CM | POA: Diagnosis not present

## 2020-11-19 DIAGNOSIS — E538 Deficiency of other specified B group vitamins: Secondary | ICD-10-CM | POA: Diagnosis not present

## 2020-11-19 DIAGNOSIS — G2581 Restless legs syndrome: Secondary | ICD-10-CM | POA: Diagnosis not present

## 2020-12-29 DIAGNOSIS — M47896 Other spondylosis, lumbar region: Secondary | ICD-10-CM | POA: Diagnosis not present

## 2021-02-04 DIAGNOSIS — E291 Testicular hypofunction: Secondary | ICD-10-CM | POA: Diagnosis not present

## 2021-02-04 DIAGNOSIS — N5201 Erectile dysfunction due to arterial insufficiency: Secondary | ICD-10-CM | POA: Diagnosis not present

## 2021-02-04 DIAGNOSIS — N400 Enlarged prostate without lower urinary tract symptoms: Secondary | ICD-10-CM | POA: Diagnosis not present

## 2021-02-17 DIAGNOSIS — N400 Enlarged prostate without lower urinary tract symptoms: Secondary | ICD-10-CM | POA: Diagnosis not present

## 2021-02-17 DIAGNOSIS — E291 Testicular hypofunction: Secondary | ICD-10-CM | POA: Diagnosis not present

## 2021-02-17 DIAGNOSIS — R351 Nocturia: Secondary | ICD-10-CM | POA: Diagnosis not present

## 2021-03-25 DIAGNOSIS — M4325 Fusion of spine, thoracolumbar region: Secondary | ICD-10-CM | POA: Diagnosis not present

## 2021-03-25 DIAGNOSIS — M48062 Spinal stenosis, lumbar region with neurogenic claudication: Secondary | ICD-10-CM | POA: Diagnosis not present

## 2021-04-09 ENCOUNTER — Other Ambulatory Visit: Payer: Self-pay

## 2021-04-09 ENCOUNTER — Ambulatory Visit (HOSPITAL_COMMUNITY)
Admission: EM | Admit: 2021-04-09 | Discharge: 2021-04-09 | Disposition: A | Discharge status: Home / Self Care | Payer: BC Managed Care – PPO | Attending: Physician Assistant | Admitting: Physician Assistant

## 2021-04-09 ENCOUNTER — Encounter (HOSPITAL_COMMUNITY): Payer: Self-pay | Admitting: *Deleted

## 2021-04-09 DIAGNOSIS — J101 Influenza due to other identified influenza virus with other respiratory manifestations: Secondary | ICD-10-CM | POA: Diagnosis not present

## 2021-04-09 DIAGNOSIS — J4521 Mild intermittent asthma with (acute) exacerbation: Secondary | ICD-10-CM

## 2021-04-09 LAB — POC INFLUENZA A AND B ANTIGEN (URGENT CARE ONLY)
INFLUENZA A ANTIGEN, POC: POSITIVE — AB
INFLUENZA B ANTIGEN, POC: NEGATIVE

## 2021-04-09 MED ORDER — OSELTAMIVIR PHOSPHATE 75 MG PO CAPS: 75.0000 mg | ORAL_CAPSULE | Freq: Two times a day (BID) | ORAL | 0 refills | Status: AC

## 2021-04-09 MED ORDER — PREDNISONE 20 MG PO TABS: 40.0000 mg | ORAL_TABLET | Freq: Every day | ORAL | 0 refills | Status: AC

## 2021-04-09 NOTE — Discharge Instructions (Signed)
You tested positive for influenza A.  Please start Tamiflu twice daily for 5 days.  Start prednisone burst to help with your asthma symptoms.  Do not take NSAIDs including aspirin, ibuprofen/Advil, naproxen/Aleve with this medication as it can cause stomach bleeding.  Continue your asthma Medication as previously prescribed.  If anything worsens and you develop any chest pain, shortness of breath, severe cough, fever not responding to medication, nausea/vomiting interfering with oral intake you need to go to the emergency room.  If symptoms have not improved by next week please return here or see your PCP.

## 2021-04-09 NOTE — ED Triage Notes (Signed)
Pt reports a Neg COVID test and has had a Flu shot this year . Pt has chest congestion and sore throat .
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
## 2021-06-07 DIAGNOSIS — E291 Testicular hypofunction: Secondary | ICD-10-CM | POA: Diagnosis not present

## 2021-06-22 DIAGNOSIS — G2581 Restless legs syndrome: Secondary | ICD-10-CM | POA: Diagnosis not present

## 2021-06-22 DIAGNOSIS — E538 Deficiency of other specified B group vitamins: Secondary | ICD-10-CM | POA: Diagnosis not present

## 2021-06-22 DIAGNOSIS — Z0000 Encounter for general adult medical examination without abnormal findings: Secondary | ICD-10-CM | POA: Diagnosis not present

## 2021-06-22 DIAGNOSIS — E785 Hyperlipidemia, unspecified: Secondary | ICD-10-CM | POA: Diagnosis not present

## 2021-06-22 DIAGNOSIS — H409 Unspecified glaucoma: Secondary | ICD-10-CM | POA: Diagnosis not present

## 2021-06-29 DIAGNOSIS — I10 Essential (primary) hypertension: Secondary | ICD-10-CM | POA: Diagnosis not present

## 2021-06-29 DIAGNOSIS — Z6830 Body mass index (BMI) 30.0-30.9, adult: Secondary | ICD-10-CM | POA: Diagnosis not present

## 2021-06-29 DIAGNOSIS — M4325 Fusion of spine, thoracolumbar region: Secondary | ICD-10-CM | POA: Diagnosis not present

## 2021-07-20 IMAGING — MR MR THORACIC SPINE W/O CM
4 of 5 series · 16 of 48 positions shown · non-contrast
Comparison: 05/07/2013

CLINICAL DATA: Mid back pain.  Bilateral leg pain.

EXAM:
MRI THORACIC SPINE WITHOUT CONTRAST
TECHNIQUE: Multiplanar, multisequence MR imaging of the thoracic spine was
performed. No intravenous contrast was administered.

[Series 4: STIR · sagittal · 3.0mm · 1.09mm/px · 3 of 19 slices shown]
[im 4/19]
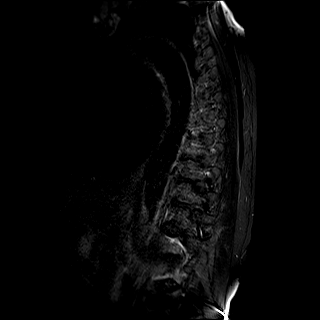
[im 11/19]
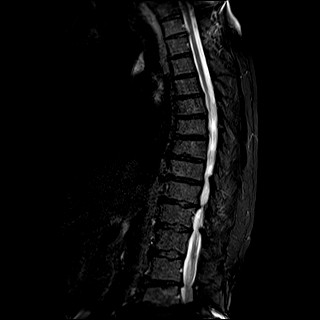
[im 19/19]
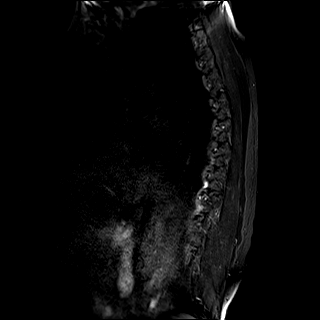

[Series 5: T2 post-contrast · sagittal · 3.0mm · 0.55mm/px · 7 of 19 slices shown]
[im 1/19]
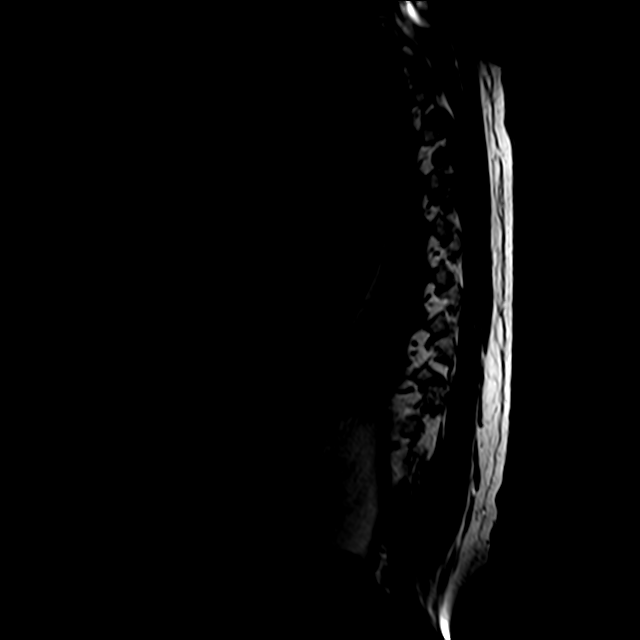
[im 4/19]
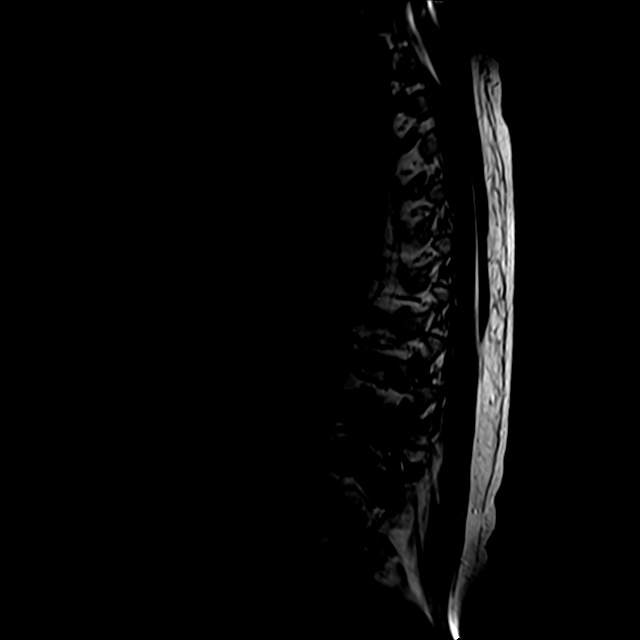
[im 7/19]
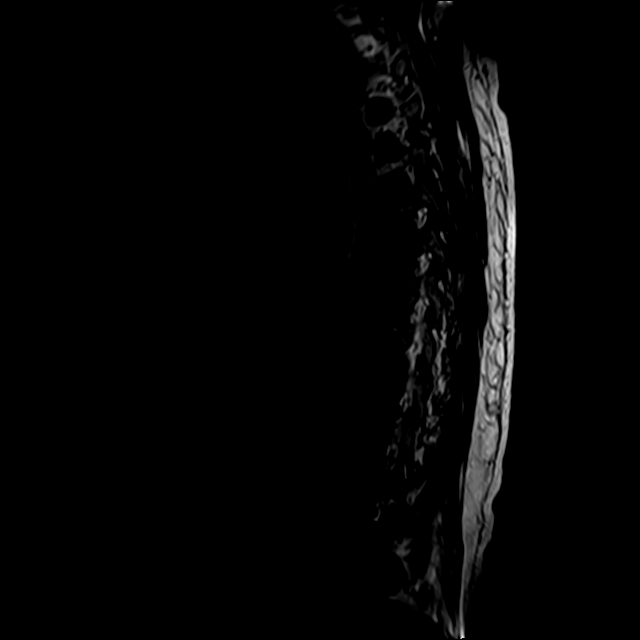
[im 10/19]
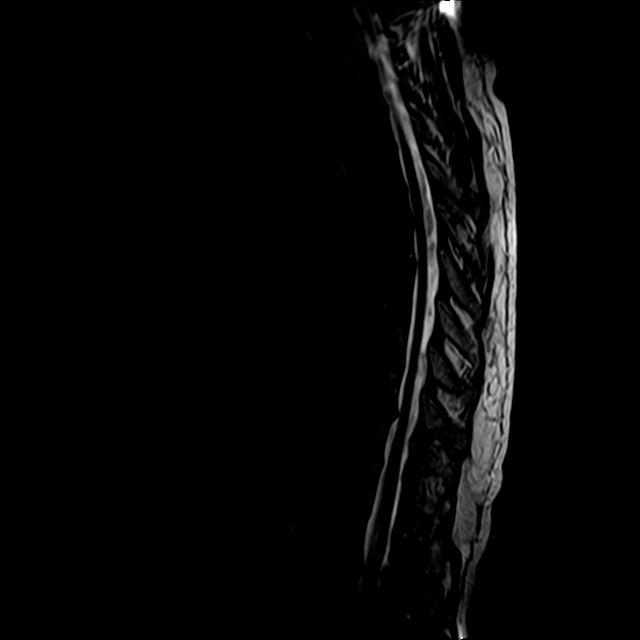
[im 13/19]
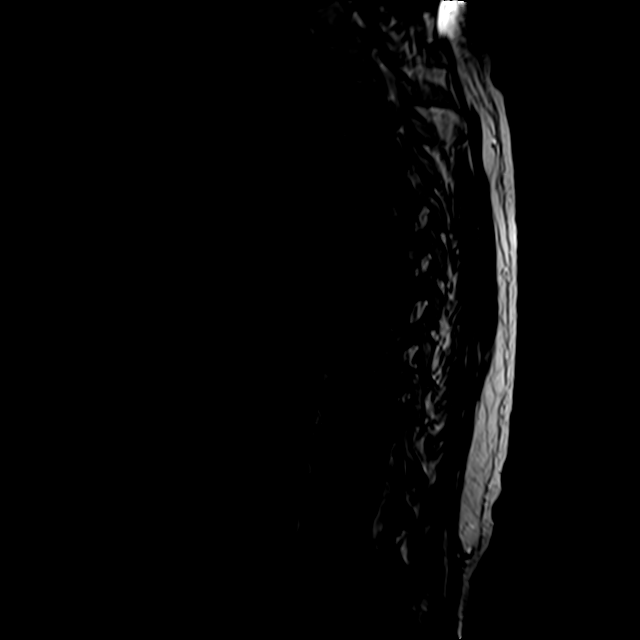
[im 16/19]
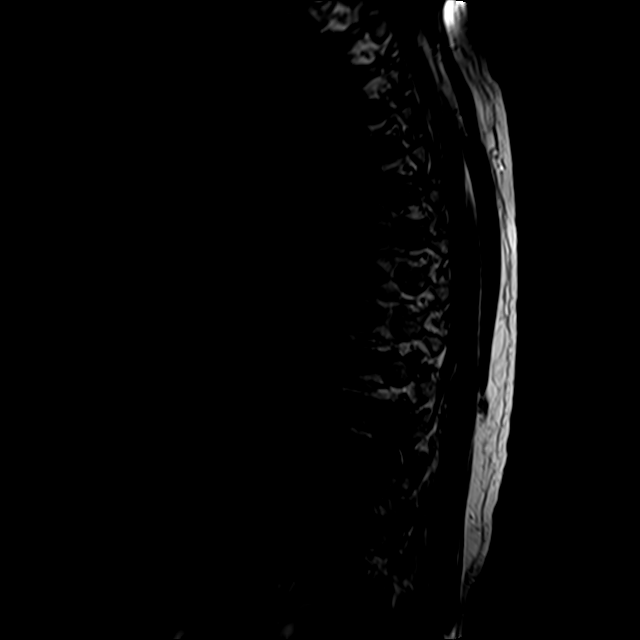
[im 19/19]
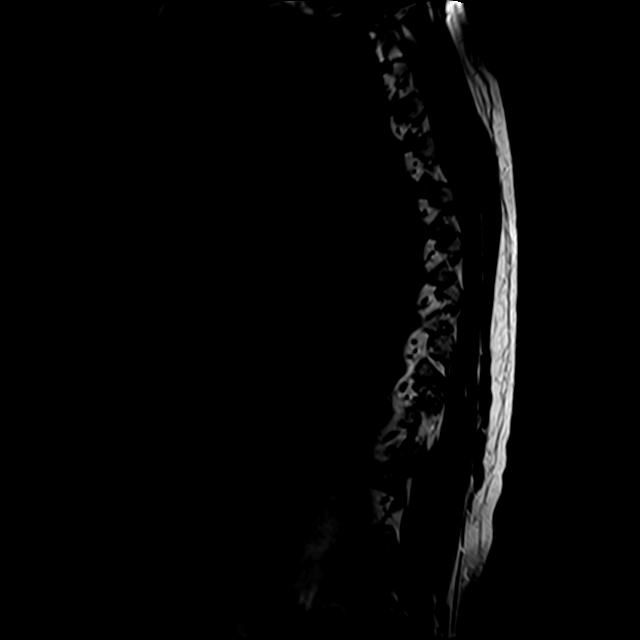

[Series 6: T1 · sagittal · 3.0mm · 0.55mm/px · 3 of 19 slices shown]
[im 4/19]
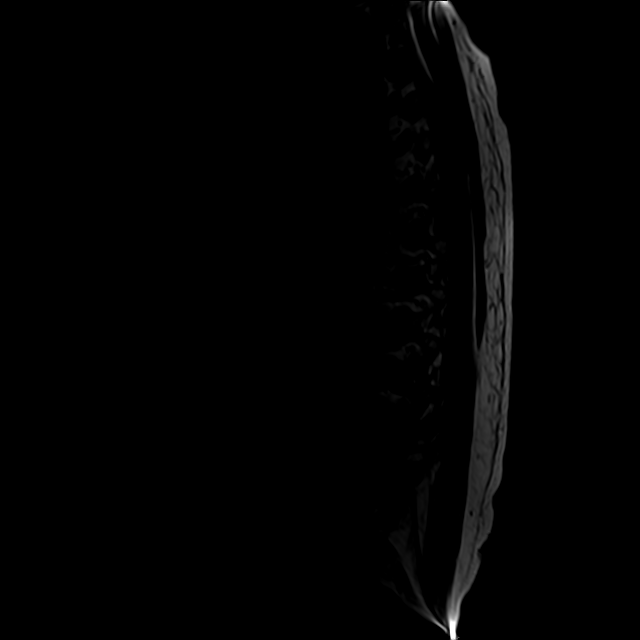
[im 10/19]
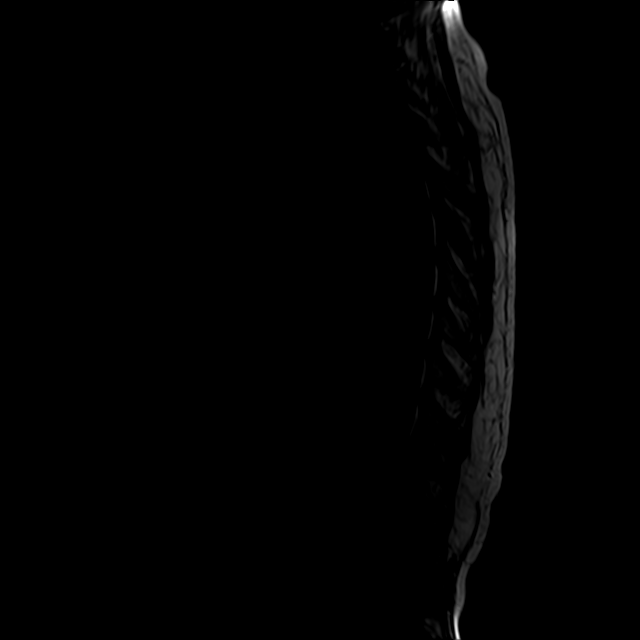
[im 16/19]
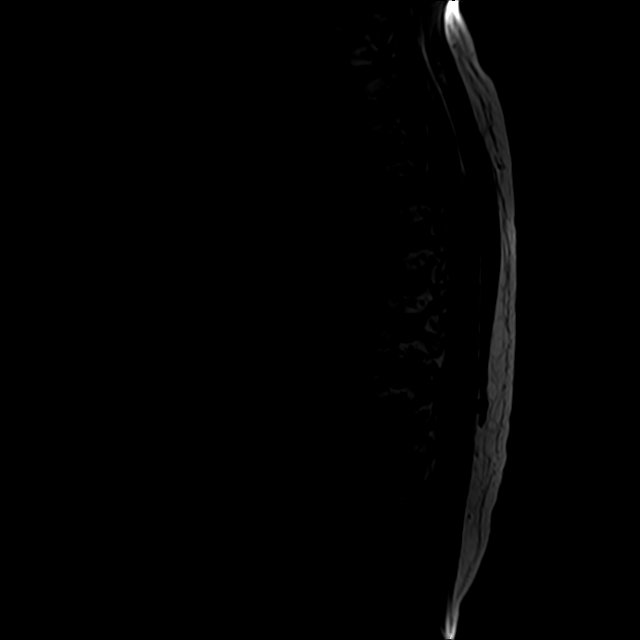

[Series 7: T2 · axial · 4.0mm · 0.39mm/px · z∈[-263,-87]mm · 3 of 39 slices shown]
[im 6/39]
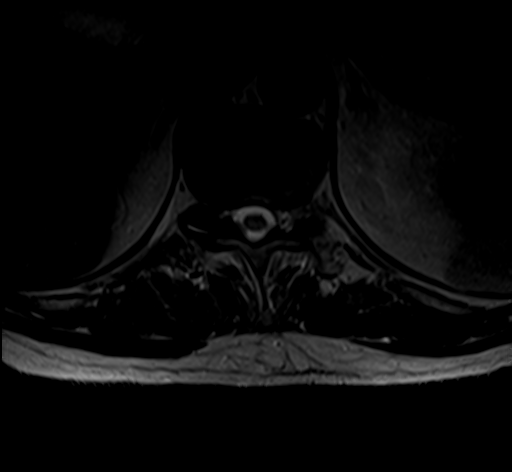
[im 21/39]
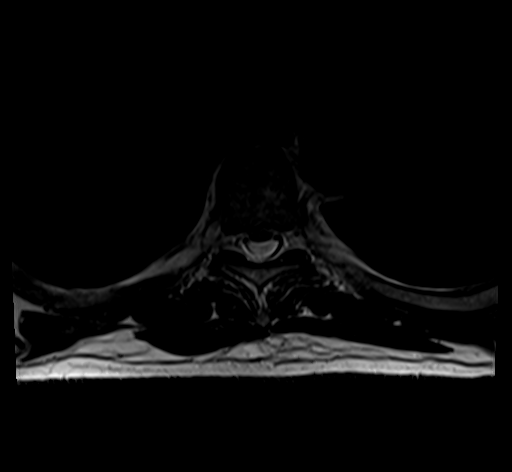
[im 33/39]
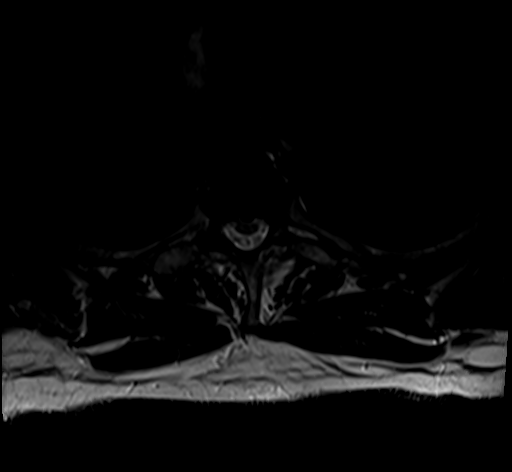

[16 of 48 positions shown; findings below may reference images not displayed]

FINDINGS: Alignment:  Normal.

Vertebrae: No fracture, suspicious osseous lesion, or evidence of
discitis. Mild multilevel degenerative endplate changes and small
Schmorl's nodes.

Cord:  Normal signal.

Paraspinal and other soft tissues: Unremarkable.

Disc levels:

Limited assessment of the cervical spine on localizer images
demonstrates prior C5-C7 ACDF and moderately advanced upper cervical
facet arthrosis.

A small central disc protrusion at T2-3, a small right paracentral
disc protrusions at T6-7, and a small right paracentral disc
extrusion at T7-8 are unchanged and without significant associated
stenosis or spinal cord compression.

A shallow left paracentral disc protrusion at T[DATE] be slightly
smaller than on the prior study.

At T9-10, there is new minimal disc bulging with a shallow left
paracentral disc protrusion and moderate facet arthrosis without
significant stenosis.

Disc bulging and facet arthrosis at T10-11 have progressed and
result in mild spinal stenosis and result in mild spinal stenosis
and mild-to-moderate right neural foraminal stenosis.

Right greater than left facet arthrosis at T11-12 is similar to the
prior study and results in mild right neural foraminal stenosis
without spinal stenosis.
IMPRESSION: 1. Progressive disc and facet degeneration at T10-11 with mild
spinal stenosis and mild-to-moderate right neural foraminal
stenosis.
2. Similar appearance of mild disc degeneration and facet arthrosis
elsewhere without significant stenosis.

## 2021-07-28 DIAGNOSIS — D509 Iron deficiency anemia, unspecified: Secondary | ICD-10-CM | POA: Diagnosis not present

## 2021-09-01 DIAGNOSIS — H401131 Primary open-angle glaucoma, bilateral, mild stage: Secondary | ICD-10-CM | POA: Diagnosis not present

## 2021-09-28 DIAGNOSIS — I10 Essential (primary) hypertension: Secondary | ICD-10-CM | POA: Diagnosis not present

## 2021-09-28 DIAGNOSIS — M4325 Fusion of spine, thoracolumbar region: Secondary | ICD-10-CM | POA: Diagnosis not present

## 2021-09-28 DIAGNOSIS — M4326 Fusion of spine, lumbar region: Secondary | ICD-10-CM | POA: Diagnosis not present

## 2021-09-28 DIAGNOSIS — Z6830 Body mass index (BMI) 30.0-30.9, adult: Secondary | ICD-10-CM | POA: Diagnosis not present

## 2021-11-02 DIAGNOSIS — H401131 Primary open-angle glaucoma, bilateral, mild stage: Secondary | ICD-10-CM | POA: Diagnosis not present

## 2022-01-05 ENCOUNTER — Other Ambulatory Visit: Payer: Self-pay | Admitting: Urology

## 2022-04-27 ENCOUNTER — Other Ambulatory Visit: Payer: Self-pay | Admitting: Sports Medicine

## 2022-04-27 ENCOUNTER — Ambulatory Visit
Admission: RE | Admit: 2022-04-27 | Discharge: 2022-04-27 | Disposition: A | Discharge status: Home / Self Care | Payer: Managed Care, Other (non HMO) | Source: Ambulatory Visit | Attending: Sports Medicine | Admitting: Sports Medicine

## 2022-04-27 DIAGNOSIS — M25531 Pain in right wrist: Secondary | ICD-10-CM

## 2022-06-12 ENCOUNTER — Other Ambulatory Visit: Payer: Self-pay | Admitting: Orthopedic Surgery

## 2022-06-14 ENCOUNTER — Encounter (HOSPITAL_COMMUNITY): Payer: Self-pay | Admitting: *Deleted

## 2022-06-15 ENCOUNTER — Other Ambulatory Visit: Payer: Self-pay

## 2022-06-15 ENCOUNTER — Encounter (HOSPITAL_COMMUNITY): Payer: Self-pay | Admitting: Orthopedic Surgery

## 2022-06-15 NOTE — Anesthesia Preprocedure Evaluation (Addendum)
Anesthesia Evaluation  Patient identified by MRN, date of birth, ID band Patient awake    Reviewed: Allergy & Precautions, NPO status , Patient's Chart, lab work & pertinent test results  History of Anesthesia Complications Negative for: history of anesthetic complications  Airway Mallampati: II  TM Distance: >3 FB Neck ROM: Full   Comment: Previous grade I view with [facility]  (+) Dental Advisory Given   Pulmonary neg shortness of breath, asthma , neg sleep apnea, neg COPD, neg recent URI, former smoker   Pulmonary exam normal breath sounds clear to auscultation       Cardiovascular hypertension, (-) angina (-) Past MI, (-) Cardiac Stents and (-) CABG (-) dysrhythmias  Rhythm:Regular Rate:Normal  HLD  TTE 05/17/2014: Study Conclusions   - Left ventricle: The cavity size was normal. Systolic function was    normal. The estimated ejection fraction was in the range of 55%    to 60%. Regional wall motion abnormalities cannot be excluded.    Doppler parameters are consistent with abnormal left ventricular    relaxation (grade 1 diastolic dysfunction). There was no evidence    of elevated ventricular filling pressure by Doppler parameters.  - Aortic valve: There was trivial regurgitation.  - Aortic root: The aortic root was normal in size.  - Mitral valve: Structurally normal valve. There was no    regurgitation.  - Right ventricle: The cavity size was normal. Wall thickness was    normal. Systolic function was normal.  - Right atrium: The atrium was normal in size.  - Tricuspid valve: There was mild regurgitation.  - Pulmonary arteries: The main pulmonary artery was normal-sized.    Systolic pressure was within the normal range.  - Inferior vena cava: The vessel was normal in size.  - Pericardium, extracardiac: There was no pericardial effusion.     Neuro/Psych neg Seizures  Neuromuscular disease (spondylosis)     GI/Hepatic negative GI ROS, Neg liver ROS,,,  Endo/Other  negative endocrine ROS    Renal/GU negative Renal ROS     Musculoskeletal  (+) Arthritis ,    Abdominal   Peds  Hematology negative hematology ROS (+)   Anesthesia Other Findings   Reproductive/Obstetrics                             Anesthesia Physical Anesthesia Plan  ASA: 2  Anesthesia Plan: MAC   Post-op Pain Management: Tylenol PO (pre-op)*   Induction: Intravenous  PONV Risk Score and Plan: 1 and Ondansetron, Dexamethasone, Propofol infusion and Treatment may vary due to age or medical condition  Airway Management Planned: Natural Airway and Simple Face Mask  Additional Equipment:   Intra-op Plan:   Post-operative Plan:   Informed Consent: I have reviewed the patients History and Physical, chart, labs and discussed the procedure including the risks, benefits and alternatives for the proposed anesthesia with the patient or authorized representative who has indicated his/her understanding and acceptance.     Dental advisory given  Plan Discussed with: Anesthesiologist and CRNA  Anesthesia Plan Comments: (Discussed with patient risks of MAC including, but not limited to, minor pain or discomfort, hearing people in the room, and possible need for backup general anesthesia. Risks for general anesthesia also discussed including, but not limited to, sore throat, hoarse voice, chipped/damaged teeth, injury to vocal cords, nausea and vomiting, allergic reactions, lung infection, heart attack, stroke, and death. All questions answered. )  Anesthesia Quick Evaluation  

## 2022-06-15 NOTE — Progress Notes (Signed)
SDW CALL  Patient was given pre-op instructions over the phone. The opportunity was given for the patient to ask questions. No further questions asked. Patient verbalized understanding of instructions given.   PCP - London Pepper, MD Cardiologist - denies  PPM/ICD - denies    Chest x-ray - N/A EKG - pt denies EKG recently- pt denies HTN and states he no longer has high cholesterol.  Stress Test - reports normal stress test possibly "years ago" ECHO - 05/17/14 Cardiac Cath - 2016  Sleep Study - denies  Fasting Blood Sugar - N/A   Blood Thinner Instructions: N/A Aspirin Instructions: N/A  ERAS Protcol - ERAS per order   COVID TEST- N/A   Anesthesia review: no  Patient denies shortness of breath, fever, cough and chest pain over the phone call    Surgical Instructions    Your procedure is scheduled on Friday March 8  Report to [facility] Main Entrance "A" at 7:00 A.M., then check in with the Admitting office.  Call this number if you have problems the morning of surgery:  [phone]    Remember:  Do not eat after midnight the night before your surgery  You may drink clear liquids until 6:30AM the morning of your surgery.   Clear liquids allowed are: Water, Non-Citrus Juices (without pulp), Carbonated Beverages, Clear Tea, Black Coffee ONLY (NO MILK, CREAM OR POWDERED CREAMER of any kind), and Gatorade   Take these medicines the morning of surgery with A SIP OF WATER: clomiphene, Flonase, Dulera, Lyrica, Flomax, eye drops, tylenol if needed    As of today, STOP taking any Aspirin (unless otherwise instructed by your surgeon) Aleve, Naproxen, Ibuprofen, Motrin, Advil, Goody's, BC's, all herbal medications, fish oil, and all vitamins.  [facility] is not responsible for any belongings or valuables.   Contacts, glasses, hearing aids, dentures or partials may not be worn into surgery, please bring cases for these belongings   Patients discharged the day of  surgery will not be allowed to drive home, and someone needs to stay with them for 24 hours.   SURGICAL WAITING ROOM VISITATION You may have 1 visitor in the pre-op area at a time determined by the pre-op nurse. (Visitor may not switch out) Patients having surgery or a procedure in a hospital may have two support people in the waiting room.    Special instructions:    Oral Hygiene is also important to reduce your risk of infection.  Remember - BRUSH YOUR TEETH THE MORNING OF SURGERY WITH YOUR REGULAR TOOTHPASTE   Day of Surgery:  Take a shower the day of and night before with antibacterial soap. Wear Clean/Comfortable clothing the morning of surgery Do not apply any deodorants/lotions.   Do not wear jewelry or makeup Do not wear lotions, powders, perfumes/colognes, or deodorant. Do not shave 48 hours prior to surgery.  Men may shave face and neck. Do not bring valuables to the hospital. Do not wear nail polish, gel polish, artificial nails, or any other type of covering on natural nails (fingers and toes)

## 2022-06-16 ENCOUNTER — Ambulatory Visit (HOSPITAL_COMMUNITY)
Admission: RE | Admit: 2022-06-16 | Discharge: 2022-06-16 | Disposition: A | Discharge status: Home / Self Care | Payer: Managed Care, Other (non HMO) | Source: Ambulatory Visit | Attending: Orthopedic Surgery | Admitting: Orthopedic Surgery

## 2022-06-16 ENCOUNTER — Encounter (HOSPITAL_COMMUNITY): Payer: Self-pay | Admitting: Orthopedic Surgery

## 2022-06-16 ENCOUNTER — Encounter (HOSPITAL_COMMUNITY)
Admission: RE | Disposition: A | Discharge status: Home / Self Care | Payer: Self-pay | Source: Ambulatory Visit | Attending: Orthopedic Surgery

## 2022-06-16 ENCOUNTER — Ambulatory Visit (HOSPITAL_COMMUNITY): Payer: Managed Care, Other (non HMO) | Admitting: Anesthesiology

## 2022-06-16 ENCOUNTER — Other Ambulatory Visit: Payer: Self-pay

## 2022-06-16 ENCOUNTER — Ambulatory Visit (HOSPITAL_BASED_OUTPATIENT_CLINIC_OR_DEPARTMENT_OTHER): Payer: Managed Care, Other (non HMO) | Admitting: Anesthesiology

## 2022-06-16 DIAGNOSIS — Z87891 Personal history of nicotine dependence: Secondary | ICD-10-CM | POA: Diagnosis not present

## 2022-06-16 DIAGNOSIS — G5601 Carpal tunnel syndrome, right upper limb: Secondary | ICD-10-CM

## 2022-06-16 DIAGNOSIS — G2581 Restless legs syndrome: Secondary | ICD-10-CM | POA: Diagnosis not present

## 2022-06-16 DIAGNOSIS — J45909 Unspecified asthma, uncomplicated: Secondary | ICD-10-CM | POA: Diagnosis not present

## 2022-06-16 DIAGNOSIS — E785 Hyperlipidemia, unspecified: Secondary | ICD-10-CM | POA: Diagnosis not present

## 2022-06-16 DIAGNOSIS — I10 Essential (primary) hypertension: Secondary | ICD-10-CM | POA: Insufficient documentation

## 2022-06-16 DIAGNOSIS — M1990 Unspecified osteoarthritis, unspecified site: Secondary | ICD-10-CM | POA: Diagnosis not present

## 2022-06-16 HISTORY — PX: CARPAL TUNNEL RELEASE: SHX101

## 2022-06-16 LAB — CBC
HCT: 47.8 % (ref 39.0–52.0)
Hemoglobin: 15.6 g/dL (ref 13.0–17.0)
MCH: 28.6 pg (ref 26.0–34.0)
MCHC: 32.6 g/dL (ref 30.0–36.0)
MCV: 87.7 fL (ref 80.0–100.0)
Platelets: 224 10*3/uL (ref 150–400)
RBC: 5.45 MIL/uL (ref 4.22–5.81)
RDW: 13.3 % (ref 11.5–15.5)
WBC: 5.4 10*3/uL (ref 4.0–10.5)
nRBC: 0 % (ref 0.0–0.2)

## 2022-06-16 LAB — BASIC METABOLIC PANEL
Anion gap: 8 (ref 5–15)
BUN: 12 mg/dL (ref 8–23)
CO2: 24 mmol/L (ref 22–32)
Calcium: 8.6 mg/dL — ABNORMAL LOW (ref 8.9–10.3)
Chloride: 105 mmol/L (ref 98–111)
Creatinine, Ser: 0.8 mg/dL (ref 0.61–1.24)
GFR, Estimated: >60 mL/min (ref >60)
Glucose, Bld: 85 mg/dL (ref 70–99)
Potassium: 3.9 mmol/L (ref 3.5–5.1)
Sodium: 137 mmol/L (ref 135–145)

## 2022-06-16 SURGERY — CARPAL TUNNEL RELEASE
Anesthesia: Monitor Anesthesia Care | Site: Hand | Laterality: Right

## 2022-06-16 MED ORDER — LIDOCAINE HCL 1 % IJ SOLN
INTRAMUSCULAR | Status: AC
  Filled 2022-06-16: qty 20

## 2022-06-16 MED ORDER — HYDROCODONE-ACETAMINOPHEN 5-325 MG PO TABS: 1.0000 | ORAL_TABLET | Freq: Four times a day (QID) | ORAL | 0 refills | Status: AC | PRN

## 2022-06-16 MED ORDER — AMISULPRIDE (ANTIEMETIC) 5 MG/2ML IV SOLN: 10.0000 mg | Freq: Once | INTRAVENOUS | Status: DC | PRN

## 2022-06-16 MED ORDER — 0.9 % SODIUM CHLORIDE (POUR BTL) OPTIME
TOPICAL | Status: DC | PRN
  Administered 2022-06-16: 1000 mL

## 2022-06-16 MED ORDER — EPHEDRINE 5 MG/ML INJ
INTRAVENOUS | Status: AC
  Filled 2022-06-16: qty 5

## 2022-06-16 MED ORDER — OXYCODONE HCL 5 MG PO TABS
ORAL_TABLET | ORAL | Status: AC
  Filled 2022-06-16: qty 1

## 2022-06-16 MED ORDER — LIDOCAINE HCL 1 % IJ SOLN
INTRAMUSCULAR | Status: DC | PRN
  Administered 2022-06-16: 10 mL

## 2022-06-16 MED ORDER — FENTANYL CITRATE (PF) 250 MCG/5ML IJ SOLN
INTRAMUSCULAR | Status: DC | PRN
  Administered 2022-06-16 (×2): 50 ug via INTRAVENOUS

## 2022-06-16 MED ORDER — PHENYLEPHRINE 80 MCG/ML (10ML) SYRINGE FOR IV PUSH (FOR BLOOD PRESSURE SUPPORT)
PREFILLED_SYRINGE | INTRAVENOUS | Status: AC
  Filled 2022-06-16: qty 10

## 2022-06-16 MED ORDER — ORAL CARE MOUTH RINSE: 15.0000 mL | Freq: Once | OROMUCOSAL | Status: AC

## 2022-06-16 MED ORDER — ONDANSETRON HCL 4 MG/2ML IJ SOLN
INTRAMUSCULAR | Status: DC | PRN
  Administered 2022-06-16: 4 mg via INTRAVENOUS

## 2022-06-16 MED ORDER — MIDAZOLAM HCL 2 MG/2ML IJ SOLN
INTRAMUSCULAR | Status: DC | PRN
  Administered 2022-06-16: 2 mg via INTRAVENOUS

## 2022-06-16 MED ORDER — LACTATED RINGERS IV SOLN: INTRAVENOUS | Status: DC

## 2022-06-16 MED ORDER — OXYCODONE HCL 5 MG/5ML PO SOLN: 5.0000 mg | Freq: Once | ORAL | Status: AC | PRN

## 2022-06-16 MED ORDER — FENTANYL CITRATE (PF) 250 MCG/5ML IJ SOLN
INTRAMUSCULAR | Status: AC
  Filled 2022-06-16: qty 5

## 2022-06-16 MED ORDER — MIDAZOLAM HCL 2 MG/2ML IJ SOLN
INTRAMUSCULAR | Status: AC
  Filled 2022-06-16: qty 2

## 2022-06-16 MED ORDER — ACETAMINOPHEN 500 MG PO TABS
1000.0000 mg | ORAL_TABLET | Freq: Once | ORAL | Status: AC
  Administered 2022-06-16: 1000 mg via ORAL
  Filled 2022-06-16: qty 2

## 2022-06-16 MED ORDER — CHLORHEXIDINE GLUCONATE 0.12 % MT SOLN
15.0000 mL | Freq: Once | OROMUCOSAL | Status: AC
  Administered 2022-06-16: 15 mL via OROMUCOSAL
  Filled 2022-06-16: qty 15

## 2022-06-16 MED ORDER — BUPIVACAINE HCL (PF) 0.25 % IJ SOLN
INTRAMUSCULAR | Status: AC
  Filled 2022-06-16: qty 30

## 2022-06-16 MED ORDER — FENTANYL CITRATE (PF) 100 MCG/2ML IJ SOLN: 25.0000 ug | INTRAMUSCULAR | Status: DC | PRN

## 2022-06-16 MED ORDER — EPHEDRINE SULFATE-NACL 50-0.9 MG/10ML-% IV SOSY
PREFILLED_SYRINGE | INTRAVENOUS | Status: DC | PRN
  Administered 2022-06-16 (×2): 5 mg via INTRAVENOUS

## 2022-06-16 MED ORDER — CEFAZOLIN SODIUM-DEXTROSE 2-4 GM/100ML-% IV SOLN
2.0000 g | INTRAVENOUS | Status: AC
  Administered 2022-06-16: 2 g via INTRAVENOUS
  Filled 2022-06-16: qty 100

## 2022-06-16 MED ORDER — PROPOFOL 10 MG/ML IV BOLUS
INTRAVENOUS | Status: DC | PRN
  Administered 2022-06-16: 20 mg via INTRAVENOUS
  Administered 2022-06-16: 50 ug/kg/min via INTRAVENOUS

## 2022-06-16 MED ORDER — PHENYLEPHRINE 80 MCG/ML (10ML) SYRINGE FOR IV PUSH (FOR BLOOD PRESSURE SUPPORT)
PREFILLED_SYRINGE | INTRAVENOUS | Status: DC | PRN
  Administered 2022-06-16: 80 ug via INTRAVENOUS

## 2022-06-16 MED ORDER — OXYCODONE HCL 5 MG PO TABS
5.0000 mg | ORAL_TABLET | Freq: Once | ORAL | Status: AC | PRN
  Administered 2022-06-16: 5 mg via ORAL

## 2022-06-16 SURGICAL SUPPLY — 49 items
ADH SKN CLS APL DERMABOND .7 (GAUZE/BANDAGES/DRESSINGS) ×1
APL PRP STRL LF DISP 70% ISPRP (MISCELLANEOUS)
BAG COUNTER SPONGE SURGICOUNT (BAG) ×1 IMPLANT
BAG SPNG CNTER NS LX DISP (BAG) ×1
BNDG CMPR 9X4 STRL LF SNTH (GAUZE/BANDAGES/DRESSINGS)
BNDG ELASTIC 3X5.8 VLCR STR LF (GAUZE/BANDAGES/DRESSINGS) ×1 IMPLANT
BNDG ELASTIC 4X5.8 VLCR STR LF (GAUZE/BANDAGES/DRESSINGS) ×1 IMPLANT
BNDG ESMARK 4X9 LF (GAUZE/BANDAGES/DRESSINGS) ×1 IMPLANT
BNDG GAUZE DERMACEA FLUFF 4 (GAUZE/BANDAGES/DRESSINGS) ×1 IMPLANT
BNDG GZE DERMACEA 4 6PLY (GAUZE/BANDAGES/DRESSINGS)
CHLORAPREP W/TINT 26 (MISCELLANEOUS) ×1 IMPLANT
CORD BIPOLAR FORCEPS 12FT (ELECTRODE) ×1 IMPLANT
COVER SURGICAL LIGHT HANDLE (MISCELLANEOUS) ×1 IMPLANT
CUFF TOURN SGL QUICK 18X4 (TOURNIQUET CUFF) ×1 IMPLANT
CUFF TOURN SGL QUICK 24 (TOURNIQUET CUFF)
CUFF TRNQT CYL 24X4X16.5-23 (TOURNIQUET CUFF) IMPLANT
DERMABOND ADVANCED .7 DNX12 (GAUZE/BANDAGES/DRESSINGS) IMPLANT
DRAPE INCISE IOBAN 66X45 STRL (DRAPES) IMPLANT
DRAPE SURG 17X23 STRL (DRAPES) ×1 IMPLANT
DRSG TEGADERM 2-3/8X2-3/4 SM (GAUZE/BANDAGES/DRESSINGS) IMPLANT
DURAPREP 26ML APPLICATOR (WOUND CARE) IMPLANT
GAUZE PAD ABD 8X10 STRL (GAUZE/BANDAGES/DRESSINGS) ×2 IMPLANT
GAUZE SPONGE 4X4 12PLY STRL (GAUZE/BANDAGES/DRESSINGS) ×1 IMPLANT
GAUZE XEROFORM 1X8 LF (GAUZE/BANDAGES/DRESSINGS) ×1 IMPLANT
GLOVE BIO SURGEON STRL SZ7.5 (GLOVE) ×2 IMPLANT
GLOVE BIOGEL PI IND STRL 8 (GLOVE) ×1 IMPLANT
GLOVE INDICATOR 7.5 STRL GRN (GLOVE) IMPLANT
GOWN STRL REUS W/ TWL LRG LVL3 (GOWN DISPOSABLE) ×2 IMPLANT
GOWN STRL REUS W/TWL LRG LVL3 (GOWN DISPOSABLE) ×2
KIT BASIN OR (CUSTOM PROCEDURE TRAY) ×1 IMPLANT
KIT TURNOVER KIT B (KITS) ×1 IMPLANT
NDL HYPO 25GX1X1/2 BEV (NEEDLE) IMPLANT
NEEDLE HYPO 25GX1X1/2 BEV (NEEDLE) ×1 IMPLANT
NS IRRIG 1000ML POUR BTL (IV SOLUTION) ×1 IMPLANT
PACK ORTHO EXTREMITY (CUSTOM PROCEDURE TRAY) ×1 IMPLANT
PAD ARMBOARD 7.5X6 YLW CONV (MISCELLANEOUS) ×2 IMPLANT
PADDING CAST ABS COTTON 4X4 ST (CAST SUPPLIES) ×1 IMPLANT
SPONGE T-LAP 4X18 ~~LOC~~+RFID (SPONGE) ×1 IMPLANT
SUCTION FRAZIER HANDLE 10FR (MISCELLANEOUS)
SUCTION TUBE FRAZIER 10FR DISP (MISCELLANEOUS) IMPLANT
SUT ETHILON 3 0 PS 1 (SUTURE) ×1 IMPLANT
SUT MNCRL AB 3-0 PS2 27 (SUTURE) IMPLANT
SUT VIC AB 3-0 SH 18 (SUTURE) ×1 IMPLANT
SYR CONTROL 10ML LL (SYRINGE) IMPLANT
TOWEL GREEN STERILE (TOWEL DISPOSABLE) ×1 IMPLANT
TOWEL GREEN STERILE FF (TOWEL DISPOSABLE) ×1 IMPLANT
TUBE CONNECTING 12X1/4 (SUCTIONS) IMPLANT
UNDERPAD 30X36 HEAVY ABSORB (UNDERPADS AND DIAPERS) ×1 IMPLANT
WATER STERILE IRR 1000ML POUR (IV SOLUTION) ×1 IMPLANT
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
## 2022-06-16 NOTE — Op Note (Signed)
NAME: Christopher Lee MEDICAL RECORD NO: [id number] DATE OF BIRTH: [birth year] FACILITY: [facility] LOCATION: [facility] OR PHYSICIAN: Ellard Artis MD   OPERATIVE REPORT   DATE OF PROCEDURE: 06/16/22    PREOPERATIVE DIAGNOSIS:  Right carpal tunnel syndrome   POSTOPERATIVE DIAGNOSIS:  same   PROCEDURE:  Right open carpal tunnel release   SURGEON: Grafton Folk. Shetara Launer, M.D.   ASSISTANT: none   ANESTHESIA:  Local with sedation   INTRAVENOUS FLUIDS:  Per anesthesia flow sheet.   ESTIMATED BLOOD LOSS:  Minimal.   COMPLICATIONS:  None.   SPECIMENS:  none   TOURNIQUET TIME:    Total Tourniquet Time Documented: Forearm (Right) - 33 minutes Total: Forearm (Right) - 33 minutes    DISPOSITION:  Stable to PACU.   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  Given the severity of his findings, he is indicated for carpal tunnel release to prevent further permanent damage.  OPERATIVE COURSE:  The patient was identified in holding and confirmed the site, side, and surgery.  We reviewed the risks, benefits, and alternatives of the procedure as well as the typical post op restrictions.    The patient was taken to the OR and transferred to the operating room table.  Placed in the supine position with the right arm extended on a hand table.  A tourniquet was placed on the forearm.  The arm was prepped and draped in typical sterile fashion.  The arm was exsanguinated with Esmarck bandage and the tourniquet inflated to 239mHg.    A 2cm incision was made in the thenar crease of the palm approximately 1cm distal to the distal wrist flexion crease.  The subcutaneous tissue was divided with tenotomies and pick ups down to the palmar fascia.  The palmar fascia was then incised with the scalpel.  The scalpel was then  used to make a small rent in the transverse carpal ligament.  The transverse carpal ligament was significantly thickened.  Fine tipped mosquitos were used to elevate the transverse carpal ligament away from the median nerve and the transverse carpal ligament was divided.  I divided this distally until visualizing the peri-vascular fat indicative of the superficial arch.  Next, the proximal portion of the transverse carpal ligament and the palmar fascia were divided.  The motor branch of the median nerve was found to cross the TCL at its proximal most portion.  This was protected.  Proximal to this, the distal forearm fascia was carefully divided with metzenbaum scissors while the median nerve was protected with a freer elevator.    The wound was irrigated and bipolar used to cauterize superficial vessels.  The wound was closed with 3-0 monocryl followed by Dermabond, gauze, and tegaderm.    Counts were correct.    AEllard Artis MD Electronically signed, 06/16/22

## 2022-06-16 NOTE — Discharge Instructions (Signed)
[facility] Hand and Upper Extrem[facility] Surgery Post-Operative Instructions    General -These instructions are to compliment information given to you by your surgeon. -You may resume your normal diet as tolerated.  -You may resume your normal medications unless specifically instructed to stop taking a certain medication. -If you are not sure about restarting one of your medications after surgery, please contact the office during normal business hours and we will be able to assist you.   Post-Operative Dressings Soft Dressings: If you do NOT have a splint or cast on, remove your surgical bandages in 4 days. At that time, you may start to shower or wash normally, but do not soak the wound in a tub, pool, dishwater, etc and do not scrub the incision. After washing, pat your incision dry and then apply a dry bandaid or gauze to cover.   If you have questions about your dressings, please call the clinic.   Post-operative Wound Care In general:  Any sutures or anything that will not fall off over time on their own, will be removed in clinic by our staff.  -If your surgical wound/incision was closed with non-absorbable sutures or staples, they will be removed at your first post-op visit.   -If your incision was closed with absorbable sutures, they do not need to be removed as they will dissolve on their own. They may be visible or buried underneath the skin.    You may have one of the following over your incision/wound.  They can all get wet and should remain in place until they fall off on their own. -Small stickers called steri-strips.  You do not need to remove them. If the ends start to peel up, you can carefully trim them with scissors. -Skin glue often known by the brand name "Dermabond".  Do not remove, it will fall off over time.  Regardless of any sutures, stickers or glue used to close your wound, do not submerge the wound in any standing water for at least 4 weeks  after surgery.  It is okay to let the shower water run over the wound and gently clean the wound with soapy water then rinse and gently pat dry.   Weightbearing/Activity  You may weightbear small items (<2 lb. such as a cup of coffee) with your operative extremity. You may write with your operative extremity. Please do not do any heavy lifting or repetitive activities with your operative arm. Do not put any pressure on your incision.   Please start finger range of motion exercises right away.  If you have a splint in place, you should move all joints that are not immobilized by a splint.   Pain Control - After your surgery, post-surgical discomfort or pain is normal. This discomfort can last several days to a few weeks. At certain times of the day (usually evenings/nights) your discomfort may be more intense.  - Do not drive while taking narcotic pain medications.   General Anesthesia or Bier Block: If you did not receive a nerve block during your surgery, you will need to start taking your pain medication shortly after your surgery and should continue to do so as prescribed by your surgeon.  Pain Medication:  Typically, post operative pain can be managed by alternating tylenol and an anti inflammatory medication.   We may also prescribe an opioid pain medication such as Oxycodone, Percocet (oxycodone with Tylenol) or Norco (hydrocodone with Tylenol) for post-operative pain. Some of these medications contain Tylenol (  acetaminophen) in them.  It takes between 30 and 45 minutes before pain medication starts to work. It is important to take your medication before your pain level gets too intense.   Nausea is a common side effect of many pain medications. You will want to eat something before taking your pain medicine to help prevent nausea.   If you are taking a prescription opioid pain medication that contains acetaminophen (Tylenol), we recommend that you do not take additional over the  counter acetaminophen (Tylenol).   If you are prescribed oxycodone WITHOUT acetaminophen (Tylenol) in it and you do not have a known allergy, we recommend you take over-the-counter acetaminophen (Tylenol) with the oxycodone.  Take over-the-counter stool softener such as Colace or Sennakot while taking narcotic pain medications to help prevent constipation.    Other pain relieving options:  Elevation: Elevating your operative extremity can be very helpful to reduce this pain. Prop your arm up on pillows to keep the operative site above the level of your heart. If you develop tingling from prolonged elevation, take a break from elevating and this should resolve.  Icing: If you do not have a splint/cast, using a cold pack to ice the affected area a few times a day (15 to 20 minutes at a time) can also help to relieve pain, reduce swelling and bruising.   If you can take nonsteroidal anti-inflammatory medications (NSAIDs, for example: Ibuprofen, Advil, Motrin, Aleve, etc.), you may take them to help control your pain. If you are unsure whether you can take anti-inflammatory medications, please check with your primary care provider.  If you are already taking a prescription of anti-inflammatory medications such as Celebrex (celecoxib) or Mobic (meloxicam) you should not take any additional anti-inflammatory drugs such as Ibuprofen, Advil, Motrin, or Aleve.    Follow Up Please call [facility] at [phone] if you do not receive or are unsure of your first follow-up appointment.  You should see your surgeon or PA 10-16 days after your surgery unless otherwise instructed.    Please call the office for any problems, including the following:  - Excessive redness of the incisions - Drainage for more than 4 days - Fever of more than 101.5 F - Nausea/vomiting that does not stop  - Numbness, tingling, or discoloration of extremity  - Unable to drink fluids  - Uncontrollable  pain     [name], MD Hand and Upper Extremity Surgery [facility]

## 2022-06-16 NOTE — Transfer of Care (Signed)
Immediate Anesthesia Transfer of Care Note  Patient: Christopher Lee  Procedure(s) Performed: RIGHT OPEN CARPAL TUNNEL RELEASE (Right: Hand)  Patient Location: PACU  Anesthesia Type:MAC  Level of Consciousness: awake, alert , and oriented  Airway & Oxygen Therapy: Patient Spontanous Breathing  Post-op Assessment: Report given to RN and Post -op Vital signs reviewed and stable  Post vital signs: Reviewed and stable  Last Vitals:  Vitals Value Taken Time  BP    Temp    Pulse 69 06/16/22 1041  Resp 12 06/16/22 1041  SpO2 99 % 06/16/22 1041  Vitals shown include unvalidated device data.  Last Pain:  Vitals:   06/16/22 0818  TempSrc:   PainSc: 0-No pain         Complications: There were no known notable events for this encounter.

## 2022-06-16 NOTE — Anesthesia Postprocedure Evaluation (Signed)
Anesthesia Post Note  Patient: Christopher Lee  Procedure(s) Performed: RIGHT OPEN CARPAL TUNNEL RELEASE (Right: Hand)     Patient location during evaluation: PACU Anesthesia Type: MAC Level of consciousness: awake Pain management: pain level controlled Vital Signs Assessment: post-procedure vital signs reviewed and stable Respiratory status: spontaneous breathing, nonlabored ventilation and respiratory function stable Cardiovascular status: stable and blood pressure returned to baseline Postop Assessment: no apparent nausea or vomiting Anesthetic complications: no   There were no known notable events for this encounter.  Last Vitals:  Vitals:   06/16/22 1045 06/16/22 1100  BP: 114/62 (!) 155/79  Pulse: 68 60  Resp: 12 15  Temp:  36.4 C  SpO2: 100% 98%    Last Pain:  Vitals:   06/16/22 1100  TempSrc:   PainSc: 0-No pain                 [name]

## 2022-06-17 ENCOUNTER — Encounter (HOSPITAL_COMMUNITY): Payer: Self-pay | Admitting: Orthopedic Surgery

## 2022-07-25 ENCOUNTER — Other Ambulatory Visit: Payer: Self-pay | Admitting: Urology

## 2022-07-29 ENCOUNTER — Other Ambulatory Visit: Payer: Self-pay | Admitting: Urology

## 2023-04-26 ENCOUNTER — Other Ambulatory Visit: Payer: Self-pay | Admitting: Urology

## 2023-04-26 DIAGNOSIS — R972 Elevated prostate specific antigen [PSA]: Secondary | ICD-10-CM

## 2023-05-03 ENCOUNTER — Encounter: Payer: Self-pay | Admitting: Urology

## 2023-05-08 ENCOUNTER — Ambulatory Visit
Admission: RE | Admit: 2023-05-08 | Discharge: 2023-05-08 | Disposition: A | Discharge status: Home / Self Care | Payer: Managed Care, Other (non HMO) | Source: Ambulatory Visit | Attending: Urology | Admitting: Urology

## 2023-05-08 DIAGNOSIS — R972 Elevated prostate specific antigen [PSA]: Secondary | ICD-10-CM

## 2023-05-08 MED ORDER — GADOPICLENOL 0.5 MMOL/ML IV SOLN
9.0000 mL | Freq: Once | INTRAVENOUS | Status: AC | PRN
  Administered 2023-05-08: 9 mL via INTRAVENOUS

## 2023-06-04 ENCOUNTER — Other Ambulatory Visit: Payer: Managed Care, Other (non HMO)
